# Patient Record
Sex: Male | Born: 1970 | Race: White | Hispanic: No | Marital: Married | Smoking: Never smoker
Health system: Southern US, Community
[De-identification: ages and names within clinical notes are randomized; demographics above are authoritative.]

## PROBLEM LIST (undated history)

## (undated) DIAGNOSIS — Z8619 Personal history of other infectious and parasitic diseases: Secondary | ICD-10-CM

## (undated) HISTORY — DX: Personal history of other infectious and parasitic diseases: Z86.19

## (undated) HISTORY — PX: NO PAST SURGERIES: SHX2092

## (undated) HISTORY — PX: HERNIA REPAIR: SHX51

---

## 2016-04-22 ENCOUNTER — Encounter: Payer: Self-pay | Admitting: Osteopathic Medicine

## 2016-04-22 ENCOUNTER — Ambulatory Visit (INDEPENDENT_AMBULATORY_CARE_PROVIDER_SITE_OTHER): Payer: BLUE CROSS/BLUE SHIELD | Admitting: Osteopathic Medicine

## 2016-04-22 VITALS — BP 123/80 | HR 69 | Ht 68.0 in | Wt 152.0 lb

## 2016-04-22 DIAGNOSIS — Z0000 Encounter for general adult medical examination without abnormal findings: Secondary | ICD-10-CM

## 2016-04-22 DIAGNOSIS — Z8619 Personal history of other infectious and parasitic diseases: Secondary | ICD-10-CM

## 2016-04-22 HISTORY — DX: Personal history of other infectious and parasitic diseases: Z86.19

## 2016-04-22 LAB — CBC WITH DIFFERENTIAL/PLATELET
BASOS ABS: 0 {cells}/uL (ref 0–200)
Basophils Relative: 0 %
EOS ABS: 120 {cells}/uL (ref 15–500)
Eosinophils Relative: 2 %
HEMATOCRIT: 44.3 % (ref 38.5–50.0)
HEMOGLOBIN: 15.5 g/dL (ref 13.2–17.1)
LYMPHS ABS: 2460 {cells}/uL (ref 850–3900)
Lymphocytes Relative: 41 %
MCH: 31.6 pg (ref 27.0–33.0)
MCHC: 35 g/dL (ref 32.0–36.0)
MCV: 90.4 fL (ref 80.0–100.0)
MONO ABS: 540 {cells}/uL (ref 200–950)
MPV: 9.4 fL (ref 7.5–12.5)
Monocytes Relative: 9 %
NEUTROS ABS: 2880 {cells}/uL (ref 1500–7800)
NEUTROS PCT: 48 %
Platelets: 249 10*3/uL (ref 140–400)
RBC: 4.9 MIL/uL (ref 4.20–5.80)
RDW: 12.9 % (ref 11.0–15.0)
WBC: 6 10*3/uL (ref 3.8–10.8)

## 2016-04-22 MED ORDER — ZOSTER VACCINE LIVE 19400 UNT/0.65ML ~~LOC~~ SUSR: 0.6500 mL | Freq: Once | SUBCUTANEOUS | Status: DC

## 2016-04-22 NOTE — Progress Notes (Signed)
HPI: Randy Hess is a 45 y.o. male who presents to [facility] today for chief complaint of:  Chief Complaint  Patient presents with  . Establish Care    ANNUAL AND BLOOD PRESSURE    See below for preventive care review. No concerns today.  Of note, patient has history of shingles outbreak few years ago. No history of vaccination. He has concerns about high blood pressure given family history. Has been a while since he has had any blood work done for routine screening. Previously in [employer] [place] [employer], he is pretty sure that he had a and HIV screening done at that point. Tetanus vaccine about 7 years ago  Past medical, social and family history reviewed: Past Medical History  Diagnosis Date  . History of shingles 04/22/2016   History reviewed. No pertinent past surgical history. Social History  Substance Use Topics  . Smoking status: Never Smoker   . Smokeless tobacco: Not on file  . Alcohol Use: Not on file   Family History  Problem Relation Age of Onset  . Hyperlipidemia Mother   . Hypertension Mother   . Diabetes Father   . Hyperlipidemia Father   . Hypertension Father   . Alcohol abuse Brother   . Hyperlipidemia Brother   . Hypertension Brother     Current Outpatient Prescriptions  Medication Sig Dispense Refill  . aspirin 81 MG tablet Take 81 mg by mouth daily.    . Multiple Vitamin (MULTIVITAMIN) tablet Take 1 tablet by mouth daily.     No current facility-administered medications for this visit.   No Known Allergies    Review of Systems: CONSTITUTIONAL:  No  fever, no chills, No recent illness, No unintentional weight changes HEAD/EYES/EARS/NOSE/THROAT: No  headache, no vision change, no hearing change, No sore throat, No  sinus pressure CARDIAC: No  chest pain, No  pressure, No palpitations, No  orthopnea RESPIRATORY: No  cough, No  shortness of breath/wheeze GASTROINTESTINAL: No  nausea, No  vomiting, No  abdominal pain, No   blood in stool, No  diarrhea, No  constipation  MUSCULOSKELETAL: No  myalgia/arthralgia GENITOURINARY: No  incontinence, No  abnormal genital bleeding/discharge SKIN: No  rash/wounds/concerning lesions HEM/ONC: No  easy bruising/bleeding, No  abnormal lymph node ENDOCRINE: No polyuria/polydipsia/polyphagia, No  heat/cold intolerance  NEUROLOGIC: No  weakness, No  dizziness, No  slurred speech PSYCHIATRIC: No  concerns with depression, No  concerns with anxiety, No sleep problems  Exam:  BP 123/80 mmHg  Pulse 69  Ht '5\' 8"'$  (1.727 m)  Wt 152 lb (68.947 kg)  BMI 23.12 kg/m2 Constitutional: VS see above. General Appearance: alert, well-developed, well-nourished, NAD Eyes: Normal lids and conjunctive, non-icteric sclera, PERRLA Ears, Nose, Mouth, Throat: MMM, Normal external inspection ears/nares/mouth/lips/gums, TM normal bilaterally. Pharynx/tonsils no erythema, no exudate. Nasal mucosa normal.  Neck: No masses, trachea midline. No thyroid enlargement. No tenderness/mass appreciated. No lymphadenopathy Respiratory: Normal respiratory effort. no wheeze, no rhonchi, no rales Cardiovascular: S1/S2 normal, no murmur, no rub/gallop auscultated. RRR. No lower extremity edema. Gastrointestinal: Nontender, no masses. No hepatomegaly, no splenomegaly. No hernia appreciated. Bowel sounds normal. Rectal exam deferred.  Musculoskeletal: Gait normal. No clubbing/cyanosis of digits.  Neurological: No cranial nerve deficit on limited exam. Motor and sensation intact and symmetric Skin: warm, dry, intact. No rash/ulcer. No concerning nevi or subq nodules on limited exam.   Psychiatric: Normal judgment/insight. Normal mood and affect. Oriented x3.      ASSESSMENT/PLAN: Very pleasant patient, no major concerns  based on history for early cancer screening, we discussed flu vaccine. He probably doesn't need to be taking an aspirin given his low risk factors, no diabetes, no blood pressure problems, no history  of MI, nonsmoker. Labs pending as below. If all normal, can come back in one year for repeat annual physical, sooner if needed.  Annual physical exam - Plan: CBC with Differential/Platelet, COMPLETE METABOLIC PANEL WITH GFR, Lipid panel  History of shingles - Plan: Zoster Vaccine Live, PF, (ZOSTAVAX) 58832 UNT/0.65ML injection    MALE PREVENTIVE CARE  ANNUAL SCREENING/COUNSELING Tobacco - Never  Alcohol - social drinker Diet/Exercise - HEALTHY HABITS DISCUSSED TO DECREASE CV RISK Sexual Health - yes with male  STI - no need for STI testing  INTERESTED IN STI TESTING - no Depression - PQH2 Negative Domestic violence concerns - no HTN SCREENING - SEE VITALS Vaccination status - SEE BELOW  INFECTIOUS DISEASE SCREENING HIV - all adults 15-65 - does not need GC/CT - sexually active - does not need HepC - born 39-1966 - does not need TB - if risk/required by employer - does not need  DISEASE SCREENING Lipid - (Low risk screen M35; High risk screen M25 if HTN, Tob, FH CHD M<55/F<65) - needs DM2 (45+ or Risk = FH 1st deg DM, Hx GDM, overweight/sedentary, high-risk ethnicity, HTN) - needs Osteoporosis - age 60+ or one sooner if risk - does not need AAA - once age 28-75 if smoker - does not need  CANCER SCREENING Lung - annual low dose CT Chest age 45-85 w/ 30+ PY, current/quit past 15 years - CT - does not need Colon - age 7+ or 46 years of age prior to [place] Dx - GI REFERRAL - does not need Prostate - (shared decision making age 37 in average-risk, age 75 to 80 high risk black men, men with a family history of prostate cancer younger than age 70, BRCA1 or BRCA2) - does not need  ADULT VACCINATION Influenza - annual - was not indicated Td booster every 10 years - already has HPV - age <5yo - was not indicated Zoster - age 74+ - was given Pneumonia - age 18+ sooner if risk (DM, smoker, other) - was not indicated  OTHER Fall - exercise and Vit D age 24+ - does not need Consider  ASA - age 48-59 - does not need  All questions were answered. Visit summary with medication list and pertinent instructions was printed for patient to review. ER/RTC precautions were reviewed with the patient. Return in about 1 year (around 04/22/2017), or sooner if needed, for [employer] .

## 2016-04-23 LAB — COMPLETE METABOLIC PANEL WITH GFR
ALBUMIN: 4.6 g/dL (ref 3.6–5.1)
ALK PHOS: 48 U/L (ref 40–115)
ALT: 13 U/L (ref 9–46)
AST: 17 U/L (ref 10–40)
BILIRUBIN TOTAL: 0.6 mg/dL (ref 0.2–1.2)
BUN: 22 mg/dL (ref 7–25)
CALCIUM: 9.6 mg/dL (ref 8.6–10.3)
CO2: 27 mmol/L (ref 20–31)
Chloride: 104 mmol/L (ref 98–110)
Creat: 1.08 mg/dL (ref 0.60–1.35)
GFR, EST NON AFRICAN AMERICAN: 82 mL/min (ref >=60)
GFR, Est African American: >89 mL/min (ref >=60)
Glucose, Bld: 79 mg/dL (ref 65–99)
POTASSIUM: 4.5 mmol/L (ref 3.5–5.3)
Sodium: 140 mmol/L (ref 135–146)
TOTAL PROTEIN: 7.2 g/dL (ref 6.1–8.1)

## 2016-04-23 LAB — LIPID PANEL
CHOL/HDL RATIO: 3 ratio (ref <=5.0)
CHOLESTEROL: 152 mg/dL (ref 125–200)
HDL: 51 mg/dL (ref >=40)
LDL Cholesterol: 83 mg/dL (ref <130)
Triglycerides: 91 mg/dL (ref <150)
VLDL: 18 mg/dL (ref <30)

## 2016-10-05 ENCOUNTER — Ambulatory Visit (INDEPENDENT_AMBULATORY_CARE_PROVIDER_SITE_OTHER): Payer: BLUE CROSS/BLUE SHIELD | Admitting: Osteopathic Medicine

## 2016-10-05 ENCOUNTER — Encounter: Payer: Self-pay | Admitting: Osteopathic Medicine

## 2016-10-05 VITALS — BP 129/78 | HR 75 | Ht 68.0 in | Wt 149.0 lb

## 2016-10-05 DIAGNOSIS — R21 Rash and other nonspecific skin eruption: Secondary | ICD-10-CM | POA: Diagnosis not present

## 2016-10-05 NOTE — Progress Notes (Signed)
HPI: Randy Hess is a 45 y.o. male  who presents [facility] today, 10/05/16,  for chief complaint of:  Chief Complaint  Patient presents with  . Rash    RIGHT SIDE OF FACE NEAR EYE    . Location: r temple and R side of nose . Quality: red, scaly. No itch or pain . Severity: mild . Duration: several years, but hasn't gotten any worse over that time   Past medical, surgical, social and family history reviewed: Patient Active Problem List   Diagnosis Date Noted  . History of shingles 04/22/2016   No past surgical history on file. Social History  Substance Use Topics  . Smoking status: Never Smoker  . Smokeless tobacco: Not on file  . Alcohol use Not on file   Family History  Problem Relation Age of Onset  . Hyperlipidemia Mother   . Hypertension Mother   . Diabetes Father   . Hyperlipidemia Father   . Hypertension Father   . Alcohol abuse Brother   . Hyperlipidemia Brother   . Hypertension Brother      Current medication list and allergy/intolerance information reviewed:   Current Outpatient Prescriptions on File Prior to Visit  Medication Sig Dispense Refill  . aspirin 81 MG tablet Take 81 mg by mouth daily.    . Multiple Vitamin (MULTIVITAMIN) tablet Take 1 tablet by mouth daily.    Marland Kitchen. Zoster Vaccine Live, PF, (ZOSTAVAX) [id number] UNT/0.65ML injection Inject 19,400 Units into the skin once. Please forward proof of vaccinatinon to Dr Lyn HollingsheadAlexander Fax # [phone] 1 each 0   No current facility-administered medications on file prior to visit.    No Known Allergies    Review of Systems:  Constitutional: No recent illness  HEENT: No  headache, no vision change  Skin: +  Rash   Exam:  BP 129/78   Pulse 75   Ht 5\' 8"  (1.727 m)   Wt 149 lb (67.6 kg)   BMI 22.66 kg/m   Constitutional: VS see above. General Appearance: alert, well-developed, well-nourished, NAD  Ears, Nose, Mouth, Throat: MMM,  Respiratory: Normal  respiratory effort.   Neurological: Normal balance/coordination. No tremor.  Skin: warm, dry, intact. Small mild blanching erythematous patches R temple and R side of nose, no ulceration/drainge, mild scaling, c/w actinic change vs ezcematous change  Psychiatric: Normal judgment/insight. Normal mood and affect. Oriented x3.        ASSESSMENT/PLAN:   Looks like actinic change to me, reassuring that no change/worsening for a few years, offered trial imiquimod vs freezing versus derm second opinion for possible biopsy, opts for referral.   Rash and nonspecific skin eruption - Plan: Ambulatory referral to Dermatology     All questions at time of visit were answered - patient instructed to contact office with any additional concerns. ER/RTC precautions were reviewed with the patient. Follow-up plan: Return if symptoms worsen or fail to improve.

## 2019-06-03 ENCOUNTER — Telehealth: Payer: Self-pay

## 2019-06-03 DIAGNOSIS — Z0000 Encounter for general adult medical examination without abnormal findings: Secondary | ICD-10-CM

## 2019-06-03 NOTE — Telephone Encounter (Signed)
Pt called - requesting lab order for annual check. As per pt, he has an appt scheduled for 08/09/19 with provider. Thanks.

## 2019-06-05 NOTE — Telephone Encounter (Signed)
Orders are in, he can come to the lab fasting at any time, does not need appointment.

## 2019-06-05 NOTE — Telephone Encounter (Signed)
Called patient and notified orders  Sent to lab and can go down and have done at his convience. KG LPN

## 2019-07-19 ENCOUNTER — Other Ambulatory Visit: Payer: Self-pay

## 2019-07-19 ENCOUNTER — Ambulatory Visit (INDEPENDENT_AMBULATORY_CARE_PROVIDER_SITE_OTHER): Payer: BLUE CROSS/BLUE SHIELD | Admitting: Family Medicine

## 2019-07-19 VITALS — BP 129/78 | HR 71 | Temp 98.1°F | Wt 148.0 lb

## 2019-07-19 DIAGNOSIS — K4090 Unilateral inguinal hernia, without obstruction or gangrene, not specified as recurrent: Secondary | ICD-10-CM | POA: Diagnosis not present

## 2019-07-19 DIAGNOSIS — Z6822 Body mass index (BMI) 22.0-22.9, adult: Secondary | ICD-10-CM

## 2019-07-19 DIAGNOSIS — Z23 Encounter for immunization: Secondary | ICD-10-CM

## 2019-07-19 NOTE — Progress Notes (Signed)
Randy Hess is a 48 y.o. male who presents to [facility] today for inguinal mass.  Patient developed a mass in his right groin about 8 days ago.  He notes that it tends to come and go throughout the day.  It tends to be worse after a long day of standing and activity and better with laying down in the evening.  He notes that sometimes a little bit sore.  He is concerned that he may have developed a hernia.  He has no prior history of hernia or abdominal surgery.  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   ROS as above:  Exam:  BP 129/78   Pulse 71   Temp 98.1 F (36.7 C) (Oral)   Wt 148 lb (67.1 kg)   BMI 22.50 kg/m  Wt Readings from Last 5 Encounters:  07/19/19 148 lb (67.1 kg)  10/05/16 149 lb (67.6 kg)  04/22/16 152 lb (68.9 kg)    Gen: Well NAD HEENT: EOMI,  MMM Lungs: Normal work of breathing. CTABL Heart: RRR no MRG Abd: NABS, Soft. Nondistended, Nontender.  Pelvis: Slight reducible bulge right inguinal region. Slight herniation present at external inguinal ring with cough.  Easily reducible.  Not particularly tender.  Testicles descended bilaterally and nontender with no masses. Exts: Brisk capillary refill, warm and well perfused.     Assessment and Plan: 48 y.o. male with right inguinal hernia.  Small and easily reducible.  It is reasonable to have evaluation with general surgery for discussion of surgical planning.  Explained that this is not an emergency and he can go on an upcoming biology research field trip in November but this should be addressed in near future.  Discussed precautions including worsening.  Patient has visit scheduled with PCP in near future for well adult visit.  Tdap and  flu vaccine administered today prior to discharge.  PDMP not reviewed this encounter. Orders Placed This Encounter  Procedures  . Tdap vaccine greater than or equal to 7yo IM  . Flu Vaccine QUAD 36+ mos IM  . Ambulatory referral to General Surgery    Referral Priority:   Routine    Referral Type:   Surgical    Referral Reason:   Specialty Services Required    Requested Specialty:   General Surgery    Number of Visits Requested:   1   No orders of the defined types were placed in this encounter.    Historical information moved to improve visibility of documentation.  Past Medical History:  Diagnosis Date  . History of shingles 04/22/2016   No past surgical history on file. Social History   Tobacco Use  . Smoking status: Never Smoker  Substance Use Topics  . Alcohol use: Not on file   family history includes Alcohol abuse in his brother; Diabetes in his father; Hyperlipidemia in his brother, father, and mother; Hypertension in his brother, father, and mother.  Medications: Current Outpatient Medications  Medication Sig Dispense Refill  . aspirin 81 MG tablet Take 81 mg by mouth daily.    . Multiple Vitamin (MULTIVITAMIN) tablet Take 1 tablet by mouth daily.     No current facility-administered medications for this visit.    No Known Allergies   Discussed warning signs or symptoms. Please see discharge instructions. Patient expresses  understanding.

## 2019-07-19 NOTE — Patient Instructions (Signed)
Thank you for coming in today.  You should hear from Surgery soon.  Let me know if you do not hear anything.  Continue normal activity.    Inguinal Hernia, Adult An inguinal hernia develops when fat or the intestines push through a weak spot in a muscle where your leg meets your lower abdomen (groin). This creates a bulge. This kind of hernia could also be:  In your scrotum, if you are male.  In folds of skin around your vagina, if you are male. There are three types of inguinal hernias:  Hernias that can be pushed back into the abdomen (are reducible). This type rarely causes pain.  Hernias that are not reducible (are incarcerated).  Hernias that are not reducible and lose their blood supply (are strangulated). This type of hernia requires emergency surgery. What are the causes? This condition is caused by having a weak spot in the muscles or tissues in the groin. This weak spot develops over time. The hernia may poke through the weak spot when you suddenly strain your lower abdominal muscles, such as when you:  Lift a heavy object.  Strain to have a bowel movement. Constipation can lead to straining.  Cough. What increases the risk? This condition is more likely to develop in:  Men.  Pregnant women.  People who: ? Are overweight. ? Work in jobs that require long periods of standing or heavy lifting. ? Have had an inguinal hernia before. ? Smoke or have lung disease. These factors can lead to long-lasting (chronic) coughing. What are the signs or symptoms? Symptoms may depend on the size of the hernia. Often, a small inguinal hernia has no symptoms. Symptoms of a larger hernia may include:  A lump in the groin area. This is easier to see when standing. It might not be visible when lying down.  Pain or burning in the groin. This may get worse when lifting, straining, or coughing.  A dull ache or a feeling of pressure in the groin.  In men, an unusual lump in the  scrotum. Symptoms of a strangulated inguinal hernia may include:  A bulge in your groin that is very painful and tender to the touch.  A bulge that turns red or purple.  Fever, nausea, and vomiting.  Inability to have a bowel movement or to pass gas. How is this diagnosed? This condition is diagnosed based on your symptoms, your medical history, and a physical exam. Your health care provider may feel your groin area and ask you to cough. How is this treated? Treatment depends on the size of your hernia and whether you have symptoms. If you do not have symptoms, your health care provider may have you watch your hernia carefully and have you come in for follow-up visits. If your hernia is large or if you have symptoms, you may need surgery to repair the hernia. Follow these instructions at home: Lifestyle  Avoid lifting heavy objects.  Avoid standing for long periods of time.  Do not use any products that contain nicotine or tobacco, such as cigarettes and e-cigarettes. If you need help quitting, ask your health care provider.  Maintain a healthy weight. Preventing constipation  Take actions to prevent constipation. Constipation leads to straining with bowel movements, which can make a hernia worse or cause a hernia repair to break down. Your health care provider may recommend that you: ? Drink enough fluid to keep your urine pale yellow. ? Eat foods that are high in fiber, such  as fresh fruits and vegetables, whole grains, and beans. ? Limit foods that are high in fat and processed sugars, such as fried or sweet foods. ? Take an over-the-counter or prescription medicine for constipation. General instructions  You may try to push the hernia back in place by very gently pressing on it while lying down. Do not try to force the bulge back in if it will not push in easily.  Watch your hernia for any changes in shape, size, or color. Get help right away if you notice any changes.  Take  over-the-counter and prescription medicines only as told by your health care provider.  Keep all follow-up visits as told by your health care provider. This is important. Contact a health care provider if:  You have a fever.  You develop new symptoms.  Your symptoms get worse. Get help right away if:  You have pain in your groin that suddenly gets worse.  You have a bulge in your groin that: ? Suddenly gets bigger and does not get smaller. ? Becomes red or purple or painful to the touch.  You are a man and you have a sudden pain in your scrotum, or the size of your scrotum suddenly changes.  You cannot push the hernia back in place by very gently pressing on it when you are lying down. Do not try to force the bulge back in if it will not push in easily.  You have nausea or vomiting that does not go away.  You have a fast heartbeat.  You cannot have a bowel movement or pass gas. These symptoms may represent a serious problem that is an emergency. Do not wait to see if the symptoms will go away. Get medical help right away. Call your local emergency services (911 in the [place] Summary  An inguinal hernia develops when fat or the intestines push through a weak spot in a muscle where your leg meets your lower abdomen (groin).  This condition is caused by having a weak spot in muscles or tissue in your groin.  Symptoms may depend on the size of the hernia, and they may include pain or swelling in your groin. A small inguinal hernia often has no symptoms.  Treatment may not be needed if you do not have symptoms. If you have symptoms or a large hernia, you may need surgery to repair the hernia.  Avoid lifting heavy objects. Also avoid standing for long amounts of time. This information is not intended to replace advice given to you by your health care provider. Make sure you discuss any questions you have with your health care provider. Document Released: 02/19/2009 Document Revised:  11/04/2017 Document Reviewed: 07/05/2017 Elsevier Patient Education  2020 [employer].

## 2019-07-26 LAB — COMPLETE METABOLIC PANEL WITH GFR
AG Ratio: 1.8 (calc) (ref 1.0–2.5)
ALT: 15 U/L (ref 9–46)
AST: 17 U/L (ref 10–40)
Albumin: 4.6 g/dL (ref 3.6–5.1)
Alkaline phosphatase (APISO): 49 U/L (ref 36–130)
BUN: 20 mg/dL (ref 7–25)
CO2: 27 mmol/L (ref 20–32)
Calcium: 9.7 mg/dL (ref 8.6–10.3)
Chloride: 103 mmol/L (ref 98–110)
Creat: 1 mg/dL (ref 0.60–1.35)
GFR, Est African American: 102 mL/min/{1.73_m2} (ref >=60)
GFR, Est Non African American: 88 mL/min/{1.73_m2} (ref >=60)
Globulin: 2.6 g/dL (calc) (ref 1.9–3.7)
Glucose, Bld: 101 mg/dL — ABNORMAL HIGH (ref 65–99)
Potassium: 4.2 mmol/L (ref 3.5–5.3)
Sodium: 140 mmol/L (ref 135–146)
Total Bilirubin: 0.7 mg/dL (ref 0.2–1.2)
Total Protein: 7.2 g/dL (ref 6.1–8.1)

## 2019-07-26 LAB — CBC
HCT: 44.8 % (ref 38.5–50.0)
Hemoglobin: 15.6 g/dL (ref 13.2–17.1)
MCH: 31.6 pg (ref 27.0–33.0)
MCHC: 34.8 g/dL (ref 32.0–36.0)
MCV: 90.9 fL (ref 80.0–100.0)
MPV: 9.7 fL (ref 7.5–12.5)
Platelets: 262 10*3/uL (ref 140–400)
RBC: 4.93 10*6/uL (ref 4.20–5.80)
RDW: 11.9 % (ref 11.0–15.0)
WBC: 5.1 10*3/uL (ref 3.8–10.8)

## 2019-07-26 LAB — LIPID PANEL
Cholesterol: 171 mg/dL (ref <200)
HDL: 52 mg/dL (ref >=40)
LDL Cholesterol (Calc): 104 mg/dL (calc) — ABNORMAL HIGH
Non-HDL Cholesterol (Calc): 119 mg/dL (calc) (ref <130)
Total CHOL/HDL Ratio: 3.3 (calc) (ref <5.0)
Triglycerides: 67 mg/dL (ref <150)

## 2019-07-26 LAB — HEMOGLOBIN A1C W/OUT EAG: Hgb A1c MFr Bld: 5 % of total Hgb (ref <5.7)

## 2019-08-09 ENCOUNTER — Ambulatory Visit (INDEPENDENT_AMBULATORY_CARE_PROVIDER_SITE_OTHER): Payer: BLUE CROSS/BLUE SHIELD | Admitting: Osteopathic Medicine

## 2019-08-09 ENCOUNTER — Encounter: Payer: Self-pay | Admitting: Osteopathic Medicine

## 2019-08-09 ENCOUNTER — Other Ambulatory Visit: Payer: Self-pay

## 2019-08-09 VITALS — BP 116/78 | HR 63 | Temp 98.1°F | Wt 148.0 lb

## 2019-08-09 DIAGNOSIS — Z0000 Encounter for general adult medical examination without abnormal findings: Secondary | ICD-10-CM | POA: Diagnosis not present

## 2019-08-09 DIAGNOSIS — Z1211 Encounter for screening for malignant neoplasm of colon: Secondary | ICD-10-CM | POA: Diagnosis not present

## 2019-08-09 DIAGNOSIS — Z23 Encounter for immunization: Secondary | ICD-10-CM | POA: Diagnosis not present

## 2019-08-09 NOTE — Patient Instructions (Signed)
General Preventive Care  Most recent routine screening lipids/other labs: already done!   Everyone should have blood pressure checked once per year.  goal 130/80 or less  Tobacco: don't!   Alcohol: responsible moderation is ok for most adults - if you have concerns about your alcohol intake, please talk to me!   Exercise: as tolerated to reduce risk of cardiovascular disease and diabetes. Strength training will also prevent osteoporosis.   Mental health: if need for mental health care (medicines, counseling, other), or concerns about moods, please let me know!   Sexual health: if need for STD testing, or if concerns with libido/pain problems, please let me know!  Advanced Directive: Living Will and/or Healthcare Power of Attorney recommended for all adults, regardless of age or health.  Vaccines  Flu vaccine: recommended for almost everyone, every fall.   Shingles vaccine: Shingrix recommended after age 70.  Pneumonia vaccines: Prevnar and Pneumovax recommended after age 60  Tetanus booster: Tdap recommended every 10 years. Due 2030 Cancer screenings   Colon cancer screening: recommended for everyone at age 23  Prostate cancer screening: PSA blood test around age 74  Lung cancer screening: not needed for non-smokers  Infection screenings . HIV: recommended screening at least once age 44-65, more often as needed. . Gonorrhea/Chlamydia: screening as needed . Hepatitis C: recommended for anyone born 46-1966 . TB: certain at-risk populations, or depending on work requirements and/or travel history Other . Bone Density Test: recommended for men at age 40, sooner depending on risk factors . Abdominal Aortic Aneurysm: screening with ultrasound recommended once for men age 39-75 who have ever smoked 100+ cigarettes

## 2019-08-09 NOTE — Progress Notes (Signed)
HPI: Randy Hess is a 48 y.o. male who  has a past medical history of History of shingles (04/22/2016).  he presents to [facility] today, 08/09/19,  for chief complaint of: Annual physical     Patient here for annual physical / wellness exam.  See preventive care reviewed as below.  Recent labs reviewed in detail with the patient.   Additional concerns today include:  Inguinal hernia bothersome but no worse, scheduled w/ surgeon      Past medical, surgical, social and family history reviewed:  Patient Active Problem List   Diagnosis Date Noted  . Inguinal hernia 07/19/2019  . Rash and nonspecific skin eruption 10/05/2016  . History of shingles 04/22/2016    No past surgical history on file.  Social History   Tobacco Use  . Smoking status: Never Smoker  . Smokeless tobacco: Never Used  Substance Use Topics  . Alcohol use: Not Currently    Alcohol/week: 0.0 standard drinks    Family History  Problem Relation Age of Onset  . Hyperlipidemia Mother   . Hypertension Mother   . Diabetes Father   . Hyperlipidemia Father   . Hypertension Father   . Alcohol abuse Brother   . Hyperlipidemia Brother   . Hypertension Brother      Current medication list and allergy/intolerance information reviewed:    Current Outpatient Medications  Medication Sig Dispense Refill  . Multiple Vitamin (MULTIVITAMIN) tablet Take 1 tablet by mouth daily.    Marland Kitchen aspirin 81 MG tablet Take 81 mg by mouth daily.     No current facility-administered medications for this visit.     No Known Allergies    Review of Systems:  Constitutional:  No  fever, no chills, No recent illness, No unintentional weight changes. No significant fatigue.   HEENT: No  headache, no vision change, no hearing change, No sore throat, No  sinus pressure  Cardiac: No  chest pain, No  pressure, No palpitations, No  Orthopnea  Respiratory:  No  shortness of breath. No   Cough  Gastrointestinal: No  abdominal pain, No  nausea, No  vomiting,  No  blood in stool, No  diarrhea, No  constipation   Musculoskeletal: No new myalgia/arthralgia  Skin: No  Rash, No other wounds/concerning lesions  Genitourinary: No  incontinence, No  abnormal genital bleeding, No abnormal genital discharge  Hem/Onc: No  easy bruising/bleeding, No  abnormal lymph node  Endocrine: No cold intolerance,  No heat intolerance. No polyuria/polydipsia/polyphagia   Neurologic: No  weakness, No  dizziness, No  slurred speech/focal weakness/facial droop  Psychiatric: No  concerns with depression, No  concerns with anxiety, No sleep problems, No mood problems  Exam:  BP 116/78 (BP Location: Left Arm, Patient Position: Sitting, Cuff Size: Normal)   Pulse 63   Temp 98.1 F (36.7 C) (Oral)   Wt 148 lb (67.1 kg)   BMI 22.50 kg/m   Constitutional: VS see above. General Appearance: alert, well-developed, well-nourished, NAD  Eyes: Normal lids and conjunctive, non-icteric sclera  Ears, Nose, Mouth, Throat:  TM normal bilaterally.   Neck: No masses, trachea midline. No thyroid enlargement. No tenderness/mass appreciated. No lymphadenopathy  Respiratory: Normal respiratory effort. no wheeze, no rhonchi, no rales  Cardiovascular: S1/S2 normal, no murmur, no rub/gallop auscultated. RRR. No lower extremity edema.   Gastrointestinal: Nontender, no masses. No hepatomegaly, no splenomegaly. No hernia appreciated. Bowel sounds normal. Rectal exam deferred.   Musculoskeletal: Gait normal. No clubbing/cyanosis of  digits.   Neurological: Normal balance/coordination. No tremor. No cranial nerve deficit on limited exam. Motor and sensation intact and symmetric. Cerebellar reflexes intact.   Skin: warm, dry, intact. No rash/ulcer. No concerning nevi or subq nodules on limited exam.    Psychiatric: Normal judgment/insight. Normal mood and affect. Oriented x3.    No results found for this or any  previous visit (from the past 72 hour(s)).  No results found.     ASSESSMENT/PLAN: The primary encounter diagnosis was Annual physical exam. Diagnoses of Colon cancer screening and Need for shingles vaccine were also pertinent to this visit.  Ordered GI referral, patient will be 49 in January and would like to get a colonoscopy set up sometime in the spring.  Set myself a reminder for nurse visit in January to get shingles shots started  Recent Results (from the past 2160 hour(s))  CBC     Status: None   Collection Time: 07/19/19 10:32 AM  Result Value Ref Range   WBC 5.1 3.8 - 10.8 Thousand/uL   RBC 4.93 4.20 - 5.80 Million/uL   Hemoglobin 15.6 13.2 - 17.1 g/dL   HCT 44.8 38.5 - 50.0 %   MCV 90.9 80.0 - 100.0 fL   MCH 31.6 27.0 - 33.0 pg   MCHC 34.8 32.0 - 36.0 g/dL   RDW 11.9 11.0 - 15.0 %   Platelets 262 140 - 400 Thousand/uL   MPV 9.7 7.5 - 12.5 fL  COMPLETE METABOLIC PANEL WITH GFR     Status: Abnormal   Collection Time: 07/19/19 10:32 AM  Result Value Ref Range   Glucose, Bld 101 (H) 65 - 99 mg/dL    Comment: .            Fasting reference interval . For someone without known diabetes, a glucose value between 100 and 125 mg/dL is consistent with prediabetes and should be confirmed with a follow-up test. .    BUN 20 7 - 25 mg/dL   Creat 1.00 0.60 - 1.35 mg/dL   GFR, Est Non African American 88 > OR = 60 mL/min/1.34m2   GFR, Est African American 102 > OR = 60 mL/min/1.45m2   BUN/Creatinine Ratio NOT APPLICABLE 6 - 22 (calc)   Sodium 140 135 - 146 mmol/L   Potassium 4.2 3.5 - 5.3 mmol/L   Chloride 103 98 - 110 mmol/L   CO2 27 20 - 32 mmol/L   Calcium 9.7 8.6 - 10.3 mg/dL   Total Protein 7.2 6.1 - 8.1 g/dL   Albumin 4.6 3.6 - 5.1 g/dL   Globulin 2.6 1.9 - 3.7 g/dL (calc)   AG Ratio 1.8 1.0 - 2.5 (calc)   Total Bilirubin 0.7 0.2 - 1.2 mg/dL   Alkaline phosphatase (APISO) 49 36 - 130 U/L   AST 17 10 - 40 U/L   ALT 15 9 - 46 U/L  Lipid panel     Status: Abnormal    Collection Time: 07/19/19 10:32 AM  Result Value Ref Range   Cholesterol 171 <200 mg/dL   HDL 52 > OR = 40 mg/dL   Triglycerides 67 <150 mg/dL   LDL Cholesterol (Calc) 104 (H) mg/dL (calc)    Comment: Reference range: <100 . Desirable range <100 mg/dL for primary prevention;   <70 mg/dL for patients with CHD or diabetic patients  with > or = 2 CHD risk factors. Marland Kitchen LDL-C is now calculated using the Martin-Hopkins  calculation, which is a validated novel method providing  better accuracy than  the Friedewald equation in the  estimation of LDL-C.  Horald Pollen et al. Lenox Ahr. 1610;960(45): 2061-2068  (http://education.[facility].com/faq/FAQ164)    Total CHOL/HDL Ratio 3.3 <5.0 (calc)   Non-HDL Cholesterol (Calc) 119 <130 mg/dL (calc)    Comment: For patients with diabetes plus 1 major ASCVD risk  factor, treating to a non-HDL-C goal of <100 mg/dL  (LDL-C of <40 mg/dL) is considered a therapeutic  option.   Hemoglobin A1C w/out eAG     Status: None   Collection Time: 07/19/19 10:32 AM  Result Value Ref Range   Hgb A1c MFr Bld 5.0 <5.7 % of total Hgb    Comment: For the purpose of screening for the presence of diabetes: . <5.7%       Consistent with the absence of diabetes 5.7-6.4%    Consistent with increased risk for diabetes             (prediabetes) > or =6.5%  Consistent with diabetes . This assay result is consistent with a decreased risk of diabetes. . Currently, no consensus exists regarding use of hemoglobin A1c for diagnosis of diabetes in children. . According to American Diabetes Association (ADA) guidelines, hemoglobin A1c <7.0% represents optimal control in non-pregnant diabetic patients. Different metrics may apply to specific patient populations.  Standards of Medical Care in Diabetes(ADA). .      Orders Placed This Encounter  Procedures  . Ambulatory referral to Gastroenterology    No orders of the defined types were placed in this  encounter.  The 10-year ASCVD risk score Denman George DC Montez Hageman., et al., 2013) is: 2%   Values used to calculate the score:     Age: 34 years     Sex: Male     Is Non-Hispanic African American: No     Diabetic: No     Tobacco smoker: No     Systolic Blood Pressure: 116 mmHg     Is BP treated: No     HDL Cholesterol: 52 mg/dL     Total Cholesterol: 171 mg/dL   Patient Instructions  General Preventive Care  Most recent routine screening lipids/other labs: already done!   Everyone should have blood pressure checked once per year.  goal 130/80 or less  Tobacco: don't!   Alcohol: responsible moderation is ok for most adults - if you have concerns about your alcohol intake, please talk to me!   Exercise: as tolerated to reduce risk of cardiovascular disease and diabetes. Strength training will also prevent osteoporosis.   Mental health: if need for mental health care (medicines, counseling, other), or concerns about moods, please let me know!   Sexual health: if need for STD testing, or if concerns with libido/pain problems, please let me know!  Advanced Directive: Living Will and/or Healthcare Power of Attorney recommended for all adults, regardless of age or health.  Vaccines  Flu vaccine: recommended for almost everyone, every fall.   Shingles vaccine: Shingrix recommended after age 12.  Pneumonia vaccines: Prevnar and Pneumovax recommended after age 70  Tetanus booster: Tdap recommended every 10 years. Due 2030 Cancer screenings   Colon cancer screening: recommended for everyone at age 65  Prostate cancer screening: PSA blood test around age 24  Lung cancer screening: not needed for non-smokers  Infection screenings . HIV: recommended screening at least once age 11-65, more often as needed. . Gonorrhea/Chlamydia: screening as needed . Hepatitis C: recommended for anyone born [birth year]-1965 . TB: certain at-risk populations, or depending on work requirements and/or travel  history  Other . Bone Density Test: recommended for men at age 49, sooner depending on risk factors . Abdominal Aortic Aneurysm: screening with ultrasound recommended once for men age 49-75 who have ever smoked 100+ cigarettes        Visit summary with medication list and pertinent instructions was printed for patient to review. All questions at time of visit were answered - patient instructed to contact office with any additional concerns or updates. ER/RTC precautions were reviewed with the patient.    Please note: voice recognition software was used to produce this document, and typos may escape review. Please contact Dr. Lyn HollingsheadAlexander for any needed clarifications.     Follow-up plan: Return in about 1 year (around 08/08/2020) [place] (call week prior to visit for lab orders).

## 2019-09-19 ENCOUNTER — Encounter (HOSPITAL_BASED_OUTPATIENT_CLINIC_OR_DEPARTMENT_OTHER): Payer: Self-pay | Admitting: *Deleted

## 2019-09-19 ENCOUNTER — Other Ambulatory Visit: Payer: Self-pay

## 2019-09-24 ENCOUNTER — Other Ambulatory Visit (HOSPITAL_COMMUNITY)
Admission: RE | Admit: 2019-09-24 | Discharge: 2019-09-24 | Disposition: A | Discharge status: Home / Self Care | Payer: BLUE CROSS/BLUE SHIELD | Source: Ambulatory Visit | Attending: Surgery | Admitting: Surgery

## 2019-09-24 DIAGNOSIS — Z01812 Encounter for preprocedural laboratory examination: Secondary | ICD-10-CM | POA: Insufficient documentation

## 2019-09-24 DIAGNOSIS — Z20828 Contact with and (suspected) exposure to other viral communicable diseases: Secondary | ICD-10-CM | POA: Diagnosis not present

## 2019-09-25 LAB — NOVEL CORONAVIRUS, NAA (HOSP ORDER, SEND-OUT TO REF LAB; TAT 18-24 HRS): SARS-CoV-2, NAA: NOT DETECTED

## 2019-09-26 NOTE — Progress Notes (Signed)
Left message for patient to pick up drink and soap.

## 2019-09-26 NOTE — H&P (Signed)
Carney Harder Documented: 07/26/2019 1:57 PM Location: [facility] Patient #: [id number] DOB: [birth year] Undefined / Language: Cleophus Molt / Race: White Male   History of Present Illness Rodman Key B. Hassell Done MD; 07/26/2019 2:55 PM) The patient is a 48 year old male who presents with an inguinal hernia. ~2 month hx of right inguinal hernia. I discussed open and laparoscopic repairs with him in detail. I gave him two books about repair. 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 He has field trips in [place] to repair his hernia in December.    Past Surgical History Emeline Gins, [place]; 07/26/2019 1:57 PM) No pertinent past surgical history   Diagnostic Studies History Emeline Gins, [place]; 07/26/2019 1:57 PM) Colonoscopy  never  Allergies Emeline Gins, CMA; 07/26/2019 1:58 PM) No Known Drug Allergies [07/26/2019]: Allergies Reconciled   Medication History Emeline Gins, [place]; 07/26/2019 1:58 PM) Medications Reconciled  Social History Emeline Gins, [place]; 07/26/2019 1:57 PM) Alcohol use  Occasional alcohol use. Caffeine use  Coffee. No drug use  Tobacco use  Never smoker.  Family History Emeline Gins, [place]; 07/26/2019 1:57 PM) Alcohol Abuse  Brother. Arthritis  Mother. Depression  Father. Diabetes Mellitus  Father. Hypertension  Brother, Father, Mother.  Other Problems Emeline Gins, [place]; 07/26/2019 1:57 PM) No pertinent past medical history     Review of Systems Emeline Gins CMA; 07/26/2019 1:57 PM) General Not Present- Appetite Loss, Chills, Fatigue, Fever, Night Sweats, Weight Gain and Weight Loss. Skin Not Present- Change in Wart/Mole, Dryness, Hives, Jaundice, New Lesions, Non-Healing Wounds, Rash and Ulcer. HEENT Not Present- Earache, Hearing Loss, Hoarseness, Nose Bleed, Oral Ulcers, Ringing in the Ears, Seasonal Allergies, Sinus Pain, Sore Throat, Visual Disturbances, Wears  glasses/contact lenses and Yellow Eyes. Respiratory Not Present- Bloody sputum, Chronic Cough, Difficulty Breathing, Snoring and Wheezing. Breast Not Present- Breast Mass, Breast Pain, Nipple Discharge and Skin Changes. Cardiovascular Not Present- Chest Pain, Difficulty Breathing Lying Down, Leg Cramps, Palpitations, Rapid Heart Rate, Shortness of Breath and Swelling of Extremities. Gastrointestinal Not Present- Abdominal Pain, Bloating, Bloody Stool, Change in Bowel Habits, Chronic diarrhea, Constipation, Difficulty Swallowing, Excessive gas, Gets full quickly at meals, Hemorrhoids, Indigestion, Nausea, Rectal Pain and Vomiting. Male Genitourinary Not Present- Blood in Urine, Change in Urinary Stream, Frequency, Impotence, Nocturia, Painful Urination, Urgency and Urine Leakage. Neurological Not Present- Decreased Memory, Fainting, Headaches, Numbness, Seizures, Tingling, Tremor, Trouble walking and Weakness. Psychiatric Not Present- Anxiety, Bipolar, Change in Sleep Pattern, Depression, Fearful and Frequent crying. Endocrine Not Present- Cold Intolerance, Excessive Hunger, Hair Changes, Heat Intolerance, Hot flashes and New Diabetes.  Vitals Emeline Gins CMA; 07/26/2019 1:58 PM) 07/26/2019 1:57 PM Weight: 147.4 lb Height: 68in Body Surface Area: 1.8 m Body Mass Index: 22.41 kg/m  Temp.: 98.14F  Pulse: 84 (Regular)  BP: 116/76 (Sitting, Left Arm, Standard)       Physical Exam (Waylin Dorko B. Hassell Done MD; 07/26/2019 2:57 PM) The physical exam findings are as follows: Note:WDWNWM NAD HEENT unremarkable Neck supple Chest clear Heart SR Abdomen nontender GU right inguinal hernia-visible left he says at times he can feel a small hernia Ext FRM    Assessment & Plan Rodman Key B. Hassell Done MD; 07/26/2019 2:58 PM) RIGHT INGUINAL HERNIA (K40.90) Impression: New right inguinal hernia-he will decide if he wants lap or open He wants to schedule in December. [facility] ok for  open--[facility] for laparoscopic  Signed by Pedro Earls, MD (07/26/2019 2:59 PM)

## 2019-09-26 NOTE — Anesthesia Preprocedure Evaluation (Addendum)
Anesthesia Evaluation  Patient identified by MRN, date of birth, ID band Patient awake    Reviewed: Allergy & Precautions, NPO status , Patient's Chart, lab work & pertinent test results, Unable to perform ROS - Chart review only  Airway Mallampati: II  TM Distance: >3 FB Neck ROM: Full    Dental no notable dental hx. (+) Teeth Intact, Dental Advisory Given   Pulmonary neg pulmonary ROS,    Pulmonary exam normal breath sounds clear to auscultation       Cardiovascular Exercise Tolerance: Good Normal cardiovascular exam Rhythm:Regular Rate:Normal     Neuro/Psych negative neurological ROS  negative psych ROS   GI/Hepatic negative GI ROS, Neg liver ROS,   Endo/Other  negative endocrine ROS  Renal/GU      Musculoskeletal negative musculoskeletal ROS (+)   Abdominal   Peds  Hematology   Anesthesia Other Findings   Reproductive/Obstetrics                          Anesthesia Physical Anesthesia Plan  ASA: II  Anesthesia Plan: General   Post-op Pain Management:    Induction: Intravenous  PONV Risk Score and Plan: 3 and Treatment may vary due to age or medical condition, Ondansetron, Dexamethasone and Midazolam  Airway Management Planned: LMA  Additional Equipment: None  Intra-op Plan:   Post-operative Plan:   Informed Consent: I have reviewed the patients History and Physical, chart, labs and discussed the procedure including the risks, benefits and alternatives for the proposed anesthesia with the patient or authorized representative who has indicated his/her understanding and acceptance.     Dental advisory given  Plan Discussed with:   Anesthesia Plan Comments:        Anesthesia Quick Evaluation

## 2019-09-27 ENCOUNTER — Other Ambulatory Visit: Payer: Self-pay

## 2019-09-27 ENCOUNTER — Encounter (HOSPITAL_BASED_OUTPATIENT_CLINIC_OR_DEPARTMENT_OTHER)
Admission: RE | Disposition: A | Discharge status: Home / Self Care | Payer: Self-pay | Source: Home / Self Care | Attending: Surgery

## 2019-09-27 ENCOUNTER — Ambulatory Visit (HOSPITAL_BASED_OUTPATIENT_CLINIC_OR_DEPARTMENT_OTHER)
Admission: RE | Admit: 2019-09-27 | Discharge: 2019-09-27 | Disposition: A | Discharge status: Home / Self Care | Payer: BLUE CROSS/BLUE SHIELD | Attending: Surgery | Admitting: Surgery

## 2019-09-27 ENCOUNTER — Ambulatory Visit (HOSPITAL_BASED_OUTPATIENT_CLINIC_OR_DEPARTMENT_OTHER): Payer: BLUE CROSS/BLUE SHIELD | Admitting: Anesthesiology

## 2019-09-27 ENCOUNTER — Encounter (HOSPITAL_BASED_OUTPATIENT_CLINIC_OR_DEPARTMENT_OTHER): Payer: Self-pay | Admitting: Surgery

## 2019-09-27 DIAGNOSIS — K4090 Unilateral inguinal hernia, without obstruction or gangrene, not specified as recurrent: Secondary | ICD-10-CM | POA: Diagnosis present

## 2019-09-27 HISTORY — PX: INGUINAL HERNIA REPAIR: SHX194

## 2019-09-27 SURGERY — REPAIR, HERNIA, INGUINAL, ADULT
Anesthesia: General | Site: Groin | Laterality: Right

## 2019-09-27 MED ORDER — LIDOCAINE HCL (CARDIAC) PF 100 MG/5ML IV SOSY
PREFILLED_SYRINGE | INTRAVENOUS | Status: DC | PRN
  Administered 2019-09-27: 100 mg via INTRAVENOUS

## 2019-09-27 MED ORDER — FENTANYL CITRATE (PF) 100 MCG/2ML IJ SOLN
50.0000 ug | INTRAMUSCULAR | Status: DC | PRN
  Administered 2019-09-27: 100 ug via INTRAVENOUS

## 2019-09-27 MED ORDER — CEFAZOLIN SODIUM-DEXTROSE 2-4 GM/100ML-% IV SOLN
INTRAVENOUS | Status: AC
  Filled 2019-09-27: qty 100

## 2019-09-27 MED ORDER — BUPIVACAINE LIPOSOME 1.3 % IJ SUSP: 20.0000 mL | Freq: Once | INTRAMUSCULAR | Status: DC

## 2019-09-27 MED ORDER — OXYCODONE HCL 5 MG PO TABS: 5.0000 mg | ORAL_TABLET | Freq: Once | ORAL | Status: DC | PRN

## 2019-09-27 MED ORDER — EPHEDRINE SULFATE 50 MG/ML IJ SOLN
INTRAMUSCULAR | Status: DC | PRN
  Administered 2019-09-27: 10 mg via INTRAVENOUS

## 2019-09-27 MED ORDER — SODIUM CHLORIDE (PF) 0.9 % IJ SOLN
INTRAMUSCULAR | Status: AC
  Filled 2019-09-27: qty 30

## 2019-09-27 MED ORDER — DEXAMETHASONE SODIUM PHOSPHATE 10 MG/ML IJ SOLN
INTRAMUSCULAR | Status: DC | PRN
  Administered 2019-09-27: 6 mg via INTRAVENOUS

## 2019-09-27 MED ORDER — ONDANSETRON HCL 4 MG/2ML IJ SOLN
4.0000 mg | Freq: Once | INTRAMUSCULAR | Status: AC | PRN
  Administered 2019-09-27: 10:00:00 4 mg via INTRAVENOUS

## 2019-09-27 MED ORDER — PROPOFOL 10 MG/ML IV BOLUS
INTRAVENOUS | Status: DC | PRN
  Administered 2019-09-27: 200 mg via INTRAVENOUS

## 2019-09-27 MED ORDER — SODIUM CHLORIDE 0.9 % IV SOLN
INTRAVENOUS | Status: DC | PRN
  Administered 2019-09-27: 10:00:00

## 2019-09-27 MED ORDER — HYDROMORPHONE HCL 1 MG/ML IJ SOLN: 0.2500 mg | INTRAMUSCULAR | Status: DC | PRN

## 2019-09-27 MED ORDER — FENTANYL CITRATE (PF) 100 MCG/2ML IJ SOLN
INTRAMUSCULAR | Status: AC
  Filled 2019-09-27: qty 2

## 2019-09-27 MED ORDER — EPHEDRINE 5 MG/ML INJ
INTRAVENOUS | Status: AC
  Filled 2019-09-27: qty 10

## 2019-09-27 MED ORDER — BUPIVACAINE HCL (PF) 0.25 % IJ SOLN
INTRAMUSCULAR | Status: DC | PRN
  Administered 2019-09-27: 5 mL

## 2019-09-27 MED ORDER — ONDANSETRON HCL 4 MG/2ML IJ SOLN
INTRAMUSCULAR | Status: AC
  Filled 2019-09-27: qty 10

## 2019-09-27 MED ORDER — HYDROCODONE-ACETAMINOPHEN 5-325 MG PO TABS: 1.0000 | ORAL_TABLET | Freq: Four times a day (QID) | ORAL | 0 refills | Status: DC | PRN

## 2019-09-27 MED ORDER — PROPOFOL 10 MG/ML IV BOLUS
INTRAVENOUS | Status: AC
  Filled 2019-09-27: qty 40

## 2019-09-27 MED ORDER — MIDAZOLAM HCL 2 MG/2ML IJ SOLN
INTRAMUSCULAR | Status: AC
  Filled 2019-09-27: qty 2

## 2019-09-27 MED ORDER — CHLORHEXIDINE GLUCONATE CLOTH 2 % EX PADS: 6.0000 | MEDICATED_PAD | Freq: Once | CUTANEOUS | Status: DC

## 2019-09-27 MED ORDER — KETOROLAC TROMETHAMINE 30 MG/ML IJ SOLN: 30.0000 mg | Freq: Once | INTRAMUSCULAR | Status: DC | PRN

## 2019-09-27 MED ORDER — DEXAMETHASONE SODIUM PHOSPHATE 10 MG/ML IJ SOLN
INTRAMUSCULAR | Status: AC
  Filled 2019-09-27: qty 3

## 2019-09-27 MED ORDER — HEPARIN SODIUM (PORCINE) 5000 UNIT/ML IJ SOLN
INTRAMUSCULAR | Status: AC
  Filled 2019-09-27: qty 1

## 2019-09-27 MED ORDER — ACETAMINOPHEN 500 MG PO TABS
1000.0000 mg | ORAL_TABLET | ORAL | Status: AC
  Administered 2019-09-27: 1000 mg via ORAL

## 2019-09-27 MED ORDER — HEPARIN SODIUM (PORCINE) 5000 UNIT/ML IJ SOLN: 5000.0000 [IU] | Freq: Once | INTRAMUSCULAR | Status: DC

## 2019-09-27 MED ORDER — LIDOCAINE 2% (20 MG/ML) 5 ML SYRINGE
INTRAMUSCULAR | Status: AC
  Filled 2019-09-27: qty 10

## 2019-09-27 MED ORDER — OXYCODONE HCL 5 MG/5ML PO SOLN: 5.0000 mg | Freq: Once | ORAL | Status: DC | PRN

## 2019-09-27 MED ORDER — LACTATED RINGERS IV SOLN
INTRAVENOUS | Status: DC
  Administered 2019-09-27: 09:00:00 via INTRAVENOUS

## 2019-09-27 MED ORDER — ACETAMINOPHEN 500 MG PO TABS
ORAL_TABLET | ORAL | Status: AC
  Filled 2019-09-27: qty 2

## 2019-09-27 MED ORDER — DEXMEDETOMIDINE HCL IN NACL 200 MCG/50ML IV SOLN
INTRAVENOUS | Status: DC | PRN
  Administered 2019-09-27: 12 ug via INTRAVENOUS

## 2019-09-27 MED ORDER — MIDAZOLAM HCL 2 MG/2ML IJ SOLN
1.0000 mg | INTRAMUSCULAR | Status: DC | PRN
  Administered 2019-09-27: 2 mg via INTRAVENOUS

## 2019-09-27 MED ORDER — CEFAZOLIN SODIUM-DEXTROSE 2-4 GM/100ML-% IV SOLN
2.0000 g | INTRAVENOUS | Status: AC
  Administered 2019-09-27: 09:00:00 2 g via INTRAVENOUS

## 2019-09-27 SURGICAL SUPPLY — 45 items
ADH SKN CLS APL DERMABOND .7 (GAUZE/BANDAGES/DRESSINGS) ×1
BLADE CLIPPER SURG (BLADE) ×2 IMPLANT
BLADE SURG 15 STRL LF DISP TIS (BLADE) ×2 IMPLANT
BLADE SURG 15 STRL SS (BLADE) ×3
CANISTER SUCT 1200ML W/VALVE (MISCELLANEOUS) ×3 IMPLANT
COVER BACK TABLE REUSABLE LG (DRAPES) ×3 IMPLANT
COVER MAYO STAND REUSABLE (DRAPES) ×3 IMPLANT
COVER WAND RF STERILE (DRAPES) ×3 IMPLANT
DECANTER SPIKE VIAL GLASS SM (MISCELLANEOUS) ×1 IMPLANT
DERMABOND ADVANCED (GAUZE/BANDAGES/DRESSINGS) ×2
DERMABOND ADVANCED .7 DNX12 (GAUZE/BANDAGES/DRESSINGS) ×1 IMPLANT
DRAIN PENROSE 1/2X12 LTX STRL (WOUND CARE) ×3 IMPLANT
DRAPE LAPAROTOMY T 102X78X121 (DRAPES) ×3 IMPLANT
DRAPE UTILITY XL STRL (DRAPES) ×3 IMPLANT
ELECT COATED BLADE 2.86 ST (ELECTRODE) ×1 IMPLANT
ELECT REM PT RETURN 9FT ADLT (ELECTROSURGICAL) ×3
ELECTRODE REM PT RTRN 9FT ADLT (ELECTROSURGICAL) ×1 IMPLANT
GLOVE BIO SURGEON STRL SZ7.5 (GLOVE) ×2 IMPLANT
GLOVE BIO SURGEON STRL SZ8 (GLOVE) ×5 IMPLANT
GLOVE BIOGEL PI IND STRL 7.5 (GLOVE) IMPLANT
GLOVE BIOGEL PI INDICATOR 7.5 (GLOVE) ×2
GOWN STRL REUS W/ TWL LRG LVL3 (GOWN DISPOSABLE) ×1 IMPLANT
GOWN STRL REUS W/ TWL XL LVL3 (GOWN DISPOSABLE) ×1 IMPLANT
GOWN STRL REUS W/TWL LRG LVL3 (GOWN DISPOSABLE) ×6
GOWN STRL REUS W/TWL XL LVL3 (GOWN DISPOSABLE) ×3
MESH HERNIA 3X6 (Mesh General) ×2 IMPLANT
NDL HYPO 25X1 1.5 SAFETY (NEEDLE) ×1 IMPLANT
NEEDLE HYPO 25X1 1.5 SAFETY (NEEDLE) ×6 IMPLANT
NS IRRIG 1000ML POUR BTL (IV SOLUTION) ×3 IMPLANT
PACK BASIN DAY SURGERY FS (CUSTOM PROCEDURE TRAY) ×3 IMPLANT
PENCIL SMOKE EVACUATOR (MISCELLANEOUS) ×3 IMPLANT
SUT MON AB 5-0 PS2 18 (SUTURE) ×3 IMPLANT
SUT PROLENE 2 0 CT2 30 (SUTURE) ×6 IMPLANT
SUT SILK 2 0 SH (SUTURE) ×2 IMPLANT
SUT VIC AB 2-0 SH 27 (SUTURE) ×3
SUT VIC AB 2-0 SH 27XBRD (SUTURE) ×1 IMPLANT
SUT VIC AB 4-0 SH 18 (SUTURE) ×3 IMPLANT
SYR 20ML LL LF (SYRINGE) ×2 IMPLANT
SYR BULB 3OZ (MISCELLANEOUS) ×3 IMPLANT
SYR CONTROL 10ML LL (SYRINGE) ×5 IMPLANT
TOWEL GREEN STERILE FF (TOWEL DISPOSABLE) ×3 IMPLANT
TRAY DSU PREP LF (CUSTOM PROCEDURE TRAY) ×3 IMPLANT
TUBE CONNECTING 20'X1/4 (TUBING) ×1
TUBE CONNECTING 20X1/4 (TUBING) ×2 IMPLANT
YANKAUER SUCT BULB TIP NO VENT (SUCTIONS) ×3 IMPLANT

## 2019-09-27 NOTE — Anesthesia Postprocedure Evaluation (Signed)
Anesthesia Post Note  Patient: Randy Hess  Procedure(s) Performed: OPEN RIGHT INGUINAL HERNIA REPAIR (Right Groin)     Patient location during evaluation: PACU Anesthesia Type: General Level of consciousness: awake and alert Pain management: pain level controlled Vital Signs Assessment: post-procedure vital signs reviewed and stable Respiratory status: spontaneous breathing, nonlabored ventilation, respiratory function stable and patient connected to nasal cannula oxygen Cardiovascular status: blood pressure returned to baseline and stable Postop Assessment: no apparent nausea or vomiting Anesthetic complications: no    Last Vitals:  Vitals:   09/27/19 1045 09/27/19 1100  BP: 115/73 119/73  Pulse: 82 82  Resp: 11 16  Temp:  36.7 C  SpO2: 97% 100%    Last Pain:  Vitals:   09/27/19 1100  TempSrc:   PainSc: 3                  Barnet Glasgow

## 2019-09-27 NOTE — Op Note (Signed)
Randy Hess  [birth year] 27 September 2019    PCP:  Emeterio Reeve, DO   Surgeon: Kaylyn Lim, MD, FACS  Asst:  Dr. Reather Laurence, MD  Anes:  general  Preop Dx: Right Inguinal hernia Postop Dx: Right indirect inguinal hernia  Procedure: Open right inguinal hernia repair with mesh Location Surgery: CDS 8 Complications: none  EBL:   minimal cc  Drains: none  Description of Procedure:  The patient was taken to OR 8 .  After anesthesia was administered and the patient was prepped  with Chloroprep and a timeout was performed.  The right side had been marked and an incision was made obliquely.  This was carried down to the external oblique which were then incised to open down to the external ring.  The cord was mobilized and on the anteromedial aspect of the cord there was a sac that dissected nicely from the cord structures.  The inside was palpated and then a high ligation was performed with a 2-0 silk.   A piece of marlex type mesh was cut to fit and sewn in to the floor along the inguinal ligament and medially into the internal oblique and transversalis with running 2-0 Prolene.  A mattress suture of prolene was used to sew the two arms of the mesh around the cord.  This was not too tight.  The external oblique was closed with running 2-0 vicryl and then 20 cc Exparel was injected.  Irrigation and closure in layers with 4-0 vicryl and 4-0 Monocryl.    The patient tolerated the procedure well and was taken to the PACU in stable condition.     [name] Done, [place], [facility], [place]

## 2019-09-27 NOTE — Discharge Instructions (Signed)
Inguinal Hernia, Adult °An inguinal hernia is when fat or your intestines push through a weak spot in a muscle where your leg meets your lower belly (groin). This causes a rounded lump (bulge). This kind of hernia could also be: °· In your scrotum, if you are male. °· In folds of skin around your vagina, if you are male. °There are three types of inguinal hernias. These include: °· Hernias that can be pushed back into the belly (are reducible). This type rarely causes pain. °· Hernias that cannot be pushed back into the belly (are incarcerated). °· Hernias that cannot be pushed back into the belly and lose their blood supply (are strangulated). This type needs emergency surgery. °If you do not have symptoms, you may not need treatment. If you have symptoms or a large hernia, you may need surgery. °Follow these instructions at home: °Lifestyle °· Do these things if told by your doctor so you do not have trouble pooping (constipation): °? Drink enough fluid to keep your pee (urine) pale yellow. °? Eat foods that have a lot of fiber. These include fresh fruits and vegetables, whole grains, and beans. °? Limit foods that are high in fat and processed sugars. These include foods that are fried or sweet. °? Take medicine for trouble pooping. °· Avoid lifting heavy objects. °· Avoid standing for long amounts of time. °· Do not use any products that contain nicotine or tobacco. These include cigarettes and e-cigarettes. If you need help quitting, ask your doctor. °· Stay at a healthy weight. °General instructions °· You may try to push your hernia in by very gently pressing on it when you are lying down. Do not try to force the bulge back in if it will not push in easily. °· Watch your hernia for any changes in shape, size, or color. Tell your doctor if you see any changes. °· Take over-the-counter and prescription medicines only as told by your doctor. °· Keep all follow-up visits as told by your doctor. This is  important. °Contact a doctor if: °· You have a fever. °· You have new symptoms. °· Your symptoms get worse. °Get help right away if: °· The area where your leg meets your lower belly has: °? Pain that gets worse suddenly. °? A bulge that gets bigger suddenly, and it does not get smaller after that. °? A bulge that turns red or purple. °? A bulge that is painful when you touch it. °· You are a man, and your scrotum: °? Suddenly feels painful. °? Suddenly changes in size. °· You cannot push the hernia in by very gently pressing on it when you are lying down. Do not try to force the bulge back in if it will not push in easily. °· You feel sick to your stomach (nauseous), and that feeling does not go away. °· You throw up (vomit), and that keeps happening. °· You have a fast heartbeat. °· You cannot poop (have a bowel movement) or pass gas. °These symptoms may be an emergency. Do not wait to see if the symptoms will go away. Get medical help right away. Call your local emergency services (911 in the [place] °Summary °· An inguinal hernia is when fat or your intestines push through a weak spot in a muscle where your leg meets your lower belly (groin). This causes a rounded lump (bulge). °· If you do not have symptoms, you may not need treatment. If you have symptoms or a large hernia, you   may need surgery.  Avoid lifting heavy objects. Also avoid standing for long amounts of time.  Do not try to force the bulge back in if it will not push in easily. This information is not intended to replace advice given to you by your health care provider. Make sure you discuss any questions you have with your health care provider. Document Released: 11/03/2006 Document Revised: 11/04/2017 Document Reviewed: 07/05/2017 Elsevier Patient Education  [place] Instructions  Activity: Get plenty of rest for the remainder of the day. A responsible individual must stay with you for 24 hours  following the procedure.  For the next 24 hours, DO NOT: -Drive a car -Paediatric nurse -Drink alcoholic beverages -Take any medication unless instructed by your physician -Make any legal decisions or sign important papers.  Meals: Start with liquid foods such as gelatin or soup. Progress to regular foods as tolerated. Avoid greasy, spicy, heavy foods. If nausea and/or vomiting occur, drink only clear liquids until the nausea and/or vomiting subsides. Call your physician if vomiting continues.  Special Instructions/Symptoms: Your throat may feel dry or sore from the anesthesia or the breathing tube placed in your throat during surgery. If this causes discomfort, gargle with warm salt water. The discomfort should disappear within 24 hours.  If you had a scopolamine patch placed behind your ear for the management of post- operative nausea and/or vomiting:  1. The medication in the patch is effective for 72 hours, after which it should be removed.  Wrap patch in a tissue and discard in the trash. Wash hands thoroughly with soap and water. 2. You may remove the patch earlier than 72 hours if you experience unpleasant side effects which may include dry mouth, dizziness or visual disturbances. 3. Avoid touching the patch. Wash your hands with soap and water after contact with the patch.  No tylenol until after 2pm

## 2019-09-27 NOTE — Transfer of Care (Signed)
Immediate Anesthesia Transfer of Care Note  Patient: Randy Hess  Procedure(s) Performed: OPEN RIGHT INGUINAL HERNIA REPAIR (Right Groin)  Patient Location: PACU  Anesthesia Type:General  Level of Consciousness: awake, alert , oriented and patient cooperative  Airway & Oxygen Therapy: Patient Spontanous Breathing  Post-op Assessment: Report given to RN, Post -op Vital signs reviewed and stable and Patient moving all extremities X 4  Post vital signs: Reviewed and stable  Last Vitals:  Vitals Value Taken Time  BP 118/76 09/27/19 1021  Temp    Pulse 88 09/27/19 1022  Resp    SpO2 98 % 09/27/19 1022  Vitals shown include unvalidated device data.  Last Pain:  Vitals:   09/27/19 0744  TempSrc: Oral  PainSc: 0-No pain      Patients Stated Pain Goal: 4 (85/88/50 2774)  Complications: No apparent anesthesia complications

## 2019-09-27 NOTE — Interval H&P Note (Signed)
History and Physical Interval Note:  09/27/2019 8:34 AM  Randy Hess  has presented today for surgery, with the diagnosis of RIGHT INGUINAL HERNIA.  The various methods of treatment have been discussed with the patient and family. After consideration of risks, benefits and other options for treatment, the patient has consented to  Procedure(s): OPEN RIGHT INGUINAL HERNIA REPAIR POSSIBLE BILATERAL (N/A) as a surgical intervention.  The patient's history has been reviewed, patient examined, no change in status, stable for surgery.  I have reviewed the patient's chart and labs.  Questions were answered to the patient's satisfaction.     Pedro Earls

## 2019-09-27 NOTE — Anesthesia Procedure Notes (Signed)
Procedure Name: LMA Insertion Date/Time: 09/27/2019 8:57 AM Performed by: Jonna Munro, CRNA Pre-anesthesia Checklist: Patient identified, Emergency Drugs available, Suction available, Patient being monitored and Timeout performed Patient Re-evaluated:Patient Re-evaluated prior to induction Oxygen Delivery Method: Circle system utilized Preoxygenation: Pre-oxygenation with 100% oxygen Induction Type: IV induction LMA: LMA inserted LMA Size: 5.0 Number of attempts: 1 Placement Confirmation: positive ETCO2 and breath sounds checked- equal and bilateral Dental Injury: Teeth and Oropharynx as per pre-operative assessment

## 2019-09-30 ENCOUNTER — Encounter: Payer: Self-pay | Admitting: *Deleted

## 2019-11-08 ENCOUNTER — Ambulatory Visit (INDEPENDENT_AMBULATORY_CARE_PROVIDER_SITE_OTHER): Payer: BLUE CROSS/BLUE SHIELD

## 2019-11-08 ENCOUNTER — Other Ambulatory Visit: Payer: Self-pay

## 2019-11-08 ENCOUNTER — Ambulatory Visit (INDEPENDENT_AMBULATORY_CARE_PROVIDER_SITE_OTHER): Payer: BLUE CROSS/BLUE SHIELD | Admitting: Sports Medicine

## 2019-11-08 ENCOUNTER — Ambulatory Visit: Payer: BLUE CROSS/BLUE SHIELD

## 2019-11-08 ENCOUNTER — Telehealth: Payer: Self-pay | Admitting: Osteopathic Medicine

## 2019-11-08 DIAGNOSIS — S43005A Unspecified dislocation of left shoulder joint, initial encounter: Secondary | ICD-10-CM

## 2019-11-08 MED ORDER — HYDROCODONE-ACETAMINOPHEN 10-325 MG PO TABS: 1.0000 | ORAL_TABLET | Freq: Three times a day (TID) | ORAL | 0 refills | Status: DC | PRN

## 2019-11-08 NOTE — Progress Notes (Addendum)
    Procedures performed today:    Procedure: Shoulder dislocation reduction   Risks, benefits, and alternatives explained and consent obtained. Time out conducted. Surface prepped with alcohol. 5 cc of lidocaine and 5 cc bupivacaine infiltrated into the glenohumeral joint Adequate anesthesia ensured. I applied axial traction and gentle abduction and external rotation of the shoulder, I then applied a laterally directed force and the shoulder reduced itself. The patient's left upper extremity was intact neurovascularly, axillary nerve distribution was intact.  Ulnar and radial artery pulses were intact. Post reduction films obtained showed anatomic/near-anatomic alignment. Pt stable, aftercare and follow-up advised.  Independent interpretation of tests performed by another provider:   None.  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 He was skiing this morning at [facility], he fell and the shoulder was brought into abduction and external rotation and dislocated. He then drove here for treatment. Today we performed a closed reduction with local anesthesia. He will go downstairs for postreduction images, hydrocodone for pain, he will need an MRI. He will wear his sling for the next 2 weeks. We will start physical therapy in about 2 weeks, he will let me know where he wants to do it. He can return to see me in 1 month.  There is a good-sized minimally displaced fracture at the inferior glenoid, as well as tear of the inferior labrum.  There is also partial tearing of the subscapularis muscle of the rotator cuff.  I would like a surgical opinion from Dr. Everardo Pacific, if he is not going to be here in [place] for long then we may consider finding a shoulder surgeon in [place] just for a consultation.  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    ___________________________________________ Randy Hess. Randy Hess, M.D., ABFM., CAQSM. Primary Care and Sports Medicine [facility]  Adjunct Instructor of Family Medicine  [employer]

## 2019-11-08 NOTE — Telephone Encounter (Addendum)
Please call patient to schedule nurse visit for shingrix! THanks!

## 2019-11-08 NOTE — Assessment & Plan Note (Addendum)
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 He was skiing this morning at [facility], he fell and the shoulder was brought into abduction and external rotation and dislocated. He then drove here for treatment. Today we performed a closed reduction with local anesthesia. He will go downstairs for postreduction images, hydrocodone for pain, he will need an MRI. He will wear his sling for the next 2 weeks. We will start physical therapy in about 2 weeks, he will let me know where he wants to do it. He can return to see me in 1 month.  There is a good-sized minimally displaced fracture at the inferior glenoid, as well as tear of the inferior labrum.  There is also partial tearing of the subscapularis muscle of the rotator cuff.  I would like a surgical opinion from Dr. Everardo Pacific, if he is not going to be here in [place] for long then we may consider finding a shoulder surgeon in [place] just for a consultation.  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

## 2019-11-10 ENCOUNTER — Other Ambulatory Visit: Payer: Self-pay

## 2019-11-10 ENCOUNTER — Ambulatory Visit (INDEPENDENT_AMBULATORY_CARE_PROVIDER_SITE_OTHER): Payer: BLUE CROSS/BLUE SHIELD

## 2019-11-10 DIAGNOSIS — S43005A Unspecified dislocation of left shoulder joint, initial encounter: Secondary | ICD-10-CM | POA: Diagnosis not present

## 2019-11-11 DIAGNOSIS — S43005A Unspecified dislocation of left shoulder joint, initial encounter: Secondary | ICD-10-CM

## 2019-11-15 ENCOUNTER — Ambulatory Visit (INDEPENDENT_AMBULATORY_CARE_PROVIDER_SITE_OTHER): Payer: BLUE CROSS/BLUE SHIELD | Admitting: Osteopathic Medicine

## 2019-11-15 ENCOUNTER — Ambulatory Visit (INDEPENDENT_AMBULATORY_CARE_PROVIDER_SITE_OTHER): Payer: BLUE CROSS/BLUE SHIELD | Admitting: Rehabilitative and Restorative Service Providers"

## 2019-11-15 ENCOUNTER — Other Ambulatory Visit: Payer: Self-pay

## 2019-11-15 ENCOUNTER — Encounter: Payer: Self-pay | Admitting: Rehabilitative and Restorative Service Providers"

## 2019-11-15 VITALS — Temp 98.1°F

## 2019-11-15 DIAGNOSIS — R29898 Other symptoms and signs involving the musculoskeletal system: Secondary | ICD-10-CM | POA: Diagnosis not present

## 2019-11-15 DIAGNOSIS — M6281 Muscle weakness (generalized): Secondary | ICD-10-CM

## 2019-11-15 DIAGNOSIS — Z23 Encounter for immunization: Secondary | ICD-10-CM

## 2019-11-15 DIAGNOSIS — M25512 Pain in left shoulder: Secondary | ICD-10-CM | POA: Diagnosis not present

## 2019-11-15 NOTE — Progress Notes (Signed)
Pt in today for first Shingrix injection.  Advised him to return in 60 days for the second dose.

## 2019-11-15 NOTE — Therapy (Signed)
[facility] [place] [address] [place], [place], [address] Phone: [phone]   Fax:  [phone]  Physical Therapy Evaluation  Patient Details  Name: Randy Hess MRN: [id number] Date of Birth: 06-Oct-1971 Referring Provider (PT): Ramond Marrow, MD   Encounter Date: 11/15/2019  PT End of Session - 11/15/19 2014    Visit Number  1    Number of Visits  12    Date for PT Re-Evaluation  02/13/20    PT Start Time  1530    PT Stop Time  1615    PT Time Calculation (min)  45 min    Activity Tolerance  Patient tolerated treatment well;No increased pain    Behavior During Therapy  WFL for tasks assessed/performed       Past Medical History:  Diagnosis Date  . History of shingles 04/22/2016    Past Surgical History:  Procedure Laterality Date  . INGUINAL HERNIA REPAIR Right 09/27/2019   Procedure: OPEN RIGHT INGUINAL HERNIA REPAIR;  Surgeon: Luretha Murphy, MD;  Location: [facility];  Service: General;  Laterality: Right;  . NO PAST SURGERIES      There were no vitals filed for this visit.   Subjective Assessment - 11/15/19 1542    Subjective  The patient sustained a L shoulder dislocation last Friday 1/22 while snow skiing.  His L shoulder was reduced by Dr. Velva Harman.  The patient wore a sling for 2 days (was recommended for 2 weeks, but he felt comfortable without it).  He is using his arm within small ranges and gets minimal discomfort when rolling over at night.    Diagnostic tests  1. Tear of the anterior inferior labrum with a fracture of theanterior inferior glenoid with mild anterior inferior displacementof the fracture fragment. Focal concavity with adjacent subcorticalmarrow edema involving the superior posterolateral humeral headconsistent with a Hill-Sachs lesion.2. Mild tendinosis of the supraspinatus tendon.3. Moderate tendinosis of the subscapularis tendon with apartial-thickness tear.4. Muscle edema  in the superior aspect of the subscapularis musclemost consistent with a small partial tear at the musculotendinousjunction.    Patient Stated Goals  Return to full functional use of the left shoulder    Currently in Pain?  No/denies   Notes discomfort and apprehension with specific movements        [facility] PT Assessment - 11/15/19 1547      Assessment   Medical Diagnosis  left shoulder dislocation    Referring Provider (PT)  Ramond Marrow, MD    Onset Date/Surgical Date  11/07/18    Hand Dominance  Right    Next MD Visit  6 weeks    Prior Therapy  none      Precautions   Precautions  Shoulder    Type of Shoulder Precautions  avoid abduction with external rotation      Restrictions   Weight Bearing Restrictions  No      Balance Screen   Has the patient fallen in the past 6 months  Yes    How many times?  injury during skiing    Has the patient had a decrease in activity level because of a fear of falling?   No    Is the patient reluctant to leave their home because of a fear of falling?   No      Home Public house manager residence      Prior Function   Level of Independence  Independent    Vocation  Full time employment    Vocation Requirements  biology professor    Leisure  exercises regularly      Presenter, broadcasting Intact      Posture/Postural Control   Posture/Postural Control  No significant limitations      ROM / Strength   AROM / PROM / Strength  AROM;Strength      AROM   Overall AROM   Deficits    Overall AROM Comments  L UE shoulder AROM not fully assessed due to only 1 week post traumatic dislocation of the shoulder.  PT had patient demonstrate movement within comfortable ROM using AAROM with cane doing supine shoulder press.  Performed AAROM into shoulder flexion to 100 degrees with AAROM cane.  Patient's R shoulder is WFL without restriction.    AROM Assessment Site  Shoulder    Right/Left Shoulder  --      Strength    Overall Strength  Deficits    Overall Strength Comments  No MMT performed due to recent traumatic dislocation.        Palpation   Palpation comment  Tenderness to palpation along anterior deltoid and pectoralis major musculature      Special Tests   Other special tests  *Apprehension noted with scaption to 30 deg with minimal ER.  *Avoiding abduction and ER                Objective measurements completed on examination: See above findings.      [facility] Adult PT Treatment/Exercise - 11/15/19 1547      Self-Care   Self-Care  Other Self-Care Comments    Other Self-Care Comments   *PT and patient discussed use of sling.  Recommended:  1 ) Nighttime use of sling due to unpredictable movements 2) use of sling during upcoming travel for field research for biology to protect shoulder.  Discussed ice pack use anteriorly x 20 minutes as needed.       Exercises   Exercises  Shoulder      Shoulder Exercises: Supine   Other Supine Exercises  Shoulder press with dowel staying within comfortable ROM.      Shoulder Exercises: Standing   Extension  Strengthening;Left;10 reps    Extension Limitations  red theraband with scapular retraction, moving through small ROM (from 20 deg flexion to neutral) emphasizing scapular retraction and stabilizatio    Other Standing Exercises  Elbow flexion with 3 lb weight L UE x 10+ reps (patient continued to perform during discussin of HEP)      Shoulder Exercises: Isometric Strengthening   Flexion  --   5 second holds x 5 reps   ABduction  --   5 second holds x 5 reps              PT Short Term Goals - 11/15/19 1952      PT SHORT TERM GOAL #1   Title  The patient will be indep with HEP for L shoulder isometrics, gentle ROM, and scapular stabilization.    Time  6    Period  Weeks    Target Date  12/30/19      PT SHORT TERM GOAL #2   Title  The patient will have full PROM of the L shoulder *ER to only be performed in plane of scapula  avoiding abd/ER*    Baseline  PROM not fully assessed as PT emphasized stabilization at neutral.    Time  6    Period  Weeks    Target Date  12/30/19      PT SHORT TERM GOAL #3   Title  The patient will have AROM in standing to 120 degrees flexion and scaption.    Time  6    Period  Weeks    Target Date  12/30/19      PT SHORT TERM GOAL #4   Title  --        PT Long Term Goals - 11/15/19 1959      PT LONG TERM GOAL #1   Title  The patient will be indep with HEP progression.    Time  12    Period  Weeks    Target Date  02/13/20      PT LONG TERM GOAL #2   Title  The patient will tolerate functional strengthening program tolerating push up x 10 reps without pain.    Time  12    Period  Weeks    Target Date  02/13/20      PT LONG TERM GOAL #3   Title  The patient will have full AROM L shoulder for flexion, abduction, ER, IR.    Time  12    Period  Weeks    Target Date  02/13/20      PT LONG TERM GOAL #4   Title  The patient will verbalize understanding of appropriate gym routine (avoid overhead press and wide arm bench press until cleared by MD) for return to community exercise.    Time  12    Period  Weeks    Target Date  02/13/20             Plan - 11/15/19 2005    Clinical Impression Statement  The patient is a 49 year old male s/p traumatic dislocation L shoulder on 11/08/2019.  PT avoided full ROM and MMT assessment due to acuity of injury to allow tissue healing.  Patient has decreased ROM, decreased strength, apprehension with minimal ER (scaption to 10 deg), mild tenderness anterior shoulder to palpation and some discomfort noted in suprspinatus region with isometric abduction.  Today's focus was to begin gentle movement of the L shoulder with AAROM and isometric strengthening to initiate stability at neutral position.  PT also recomended return to sling during sleep and during travel to avoid reflexive lifting of bags or ER movements.  PT to progress the  patient to tolerance avoiding ER and abduction at this time.  LTGs set for 12 weeks as the patient resides in [place] for work and anticipates attending PT sessions 2-3 times/month.  He appears compliant and PT will emphasize instruction in HEP due to length of time between PT visits.    Examination-Activity Limitations  Lift;Reach Overhead    Examination-Participation Restrictions  Cleaning    Stability/Clinical Decision Making  Stable/Uncomplicated    Clinical Decision Making  Low    Rehab Potential  Good    PT Frequency  1x / week    PT Duration  12 weeks    PT Treatment/Interventions  ADLs/Self Care Home Management;Electrical Stimulation;Iontophoresis 4mg /ml Dexamethasone;Moist Heat;Ultrasound;Neuromuscular re-education;Therapeutic exercise;Therapeutic activities;Patient/family education;Taping;Passive range of motion;Dry needling;Manual techniques    PT Next Visit Plan  Progress HEP, begin AAROM cane over 90 degrees of flexion if tolerated, AAROM cane shoulder extension standing, scapular stabilization exercises, isometric strengthening.  *AVOID ABD/ER; progress isotonic strengthening in plane of scapula as tolerated.    PT Home Exercise Plan  Access Code:    Consulted and Agree  with Plan of Care  Patient       Patient will benefit from skilled therapeutic intervention in order to improve the following deficits and impairments:  Pain, Hypermobility, Decreased strength, Decreased range of motion, Impaired flexibility  Visit Diagnosis: No diagnosis found.     Problem List Patient Active Problem List   Diagnosis Date Noted  . Shoulder dislocation, left, initial encounter 11/08/2019  . Inguinal hernia 07/19/2019  . Rash and nonspecific skin eruption 10/05/2016  . History of shingles 04/22/2016    [place], PT 11/15/2019, 8:15 PM  [facility] [place] [address] [place], [place], [address] Phone: [phone]    Fax:  [phone]  Name: Antolin Belsito MRN: [id number] Date of Birth: May 07, 1971

## 2019-11-15 NOTE — Patient Instructions (Signed)
Access Code: [email or web address]  URL: [email or web address]  Date: 11/15/2019  Prepared by: Margretta Ditty   Exercises Standing Bicep Curls Supinated with Dumbbells - 10 reps - 1 sets                            - 2x daily - 7x weekly Supine Shoulder Press with Dowel - 10 reps - 1 sets - 2x daily - 7x weekly Isometric Shoulder Flexion at Wall - 10 reps - 1 sets - 2x daily - 7x weekly Isometric Shoulder Abduction at Wall - 10 reps - 1 sets - 2x daily - 7x weekly Single Arm Shoulder Extension with Anchored Resistance - 10 reps - 1 sets - 2x daily - 7x weekly

## 2019-11-19 NOTE — Telephone Encounter (Signed)
Pt completed first Shingrix injection on 11/15/19.

## 2019-11-29 ENCOUNTER — Ambulatory Visit (INDEPENDENT_AMBULATORY_CARE_PROVIDER_SITE_OTHER): Payer: BLUE CROSS/BLUE SHIELD | Admitting: Physical Therapy

## 2019-11-29 ENCOUNTER — Other Ambulatory Visit: Payer: Self-pay

## 2019-11-29 DIAGNOSIS — M25512 Pain in left shoulder: Secondary | ICD-10-CM | POA: Diagnosis not present

## 2019-11-29 DIAGNOSIS — M6281 Muscle weakness (generalized): Secondary | ICD-10-CM

## 2019-11-29 DIAGNOSIS — R29898 Other symptoms and signs involving the musculoskeletal system: Secondary | ICD-10-CM

## 2019-11-29 NOTE — Therapy (Signed)
[facility] [place] [address] [place], [place], [address] Phone: [phone]   Fax:  [phone]  Physical Therapy Treatment  Patient Details  Name: Randy Hess MRN: [id number] Date of Birth: 1971/09/26 Referring Provider (PT): Ramond Marrow, MD   Encounter Date: 11/29/2019  PT End of Session - 11/29/19 1406    Visit Number  2    Number of Visits  12    Date for PT Re-Evaluation  02/13/20    PT Start Time  1402    PT Stop Time  1449    PT Time Calculation (min)  47 min    Activity Tolerance  Patient tolerated treatment well;No increased pain    Behavior During Therapy  WFL for tasks assessed/performed       Past Medical History:  Diagnosis Date  . History of shingles 04/22/2016    Past Surgical History:  Procedure Laterality Date  . INGUINAL HERNIA REPAIR Right 09/27/2019   Procedure: OPEN RIGHT INGUINAL HERNIA REPAIR;  Surgeon: Luretha Murphy, MD;  Location: [facility];  Service: General;  Laterality: Right;  . NO PAST SURGERIES      There were no vitals filed for this visit.  Subjective Assessment - 11/29/19 1521    Subjective  Pt reports he has tried to be careful with movement of Lt shoulder.  He states he is mimicing Rt arm with lifting motions, without weights when at gym.    Diagnostic tests  1. Tear of the anterior inferior labrum with a fracture of theanterior inferior glenoid with mild anterior inferior displacementof the fracture fragment. Focal concavity with adjacent subcorticalmarrow edema involving the superior posterolateral humeral headconsistent with a Hill-Sachs lesion.2. Mild tendinosis of the supraspinatus tendon.3. Moderate tendinosis of the subscapularis tendon with apartial-thickness tear.4. Muscle edema in the superior aspect of the subscapularis musclemost consistent with a small partial tear at the musculotendinousjunction.    Patient Stated Goals  Return to full functional use of  the left shoulder    Currently in Pain?  No/denies         [facility] PT Assessment - 11/29/19 0001      Assessment   Medical Diagnosis  left shoulder dislocation    Referring Provider (PT)  Ramond Marrow, MD    Onset Date/Surgical Date  11/07/18    Hand Dominance  Right    Next MD Visit  6 weeks    Prior Therapy  none      AROM   Left Shoulder Extension  30 Degrees   standing   Left Shoulder Flexion  125 Degrees   supine, AAROM    Left Shoulder Internal Rotation  --   thumb to L5   Left Shoulder External Rotation  32 Degrees   seated, arm in neutral       [facility] Adult PT Treatment/Exercise - 11/29/19 0001      Shoulder Exercises: Supine   Horizontal ABduction  AAROM;10 reps   range to tolerance, dowel   Flexion  AAROM;Both;10 reps   press up x 5, overhead flexion x 5, dowel   Other Supine Exercises  supine, Lt arm abdct ~80 deg with forearm off table to gentle chest/ bicep stretch x 15-20 sec x 3 reps       Shoulder Exercises: Standing   Extension  Both;AAROM;10 reps   dowel    Row Limitations  reverse wall push up (pushing elbows into wall) x 10     Other Standing Exercises  scap retraction  x 5 sec hold x 10 (back against pool noodle)      Other Standing Exercises  reviewed exercises to avoid that involve abdct with ER.       Shoulder Exercises: Isometric Strengthening   Flexion  5X5"    Flexion Limitations  red band, reactive isometric x 5 reps     ABduction  5X5"      Shoulder Exercises: Stretch   Internal Rotation Stretch Limitations  IR, hand behind back - to tolerance x 15 sec x 1 rep    Table Stretch - Flexion  3 reps;10 seconds    Table Stretch - Abduction  3 reps;10 seconds      Manual Therapy   Manual Therapy  Soft tissue mobilization    Soft tissue mobilization  IASTM to Lt bicep and wrist flexors to decrease fascial restrictions and improve elbow/shoulder ROM               PT Short Term Goals - 11/15/19 1952      PT SHORT TERM GOAL #1   Title   The patient will be indep with HEP for L shoulder isometrics, gentle ROM, and scapular stabilization.    Time  6    Period  Weeks    Target Date  12/30/19      PT SHORT TERM GOAL #2   Title  The patient will have full PROM of the L shoulder *ER to only be performed in plane of scapula avoiding abd/ER*    Baseline  PROM not fully assessed as PT emphasized stabilization at neutral.    Time  6    Period  Weeks    Target Date  12/30/19      PT SHORT TERM GOAL #3   Title  The patient will have AROM in standing to 120 degrees flexion and scaption.    Time  6    Period  Weeks    Target Date  12/30/19      PT SHORT TERM GOAL #4   Title  --        PT Long Term Goals - 11/15/19 1959      PT LONG TERM GOAL #1   Title  The patient will be indep with HEP progression.    Time  12    Period  Weeks    Target Date  02/13/20      PT LONG TERM GOAL #2   Title  The patient will tolerate functional strengthening program tolerating push up x 10 reps without pain.    Time  12    Period  Weeks    Target Date  02/13/20      PT LONG TERM GOAL #3   Title  The patient will have full AROM L shoulder for flexion, abduction, ER, IR.    Time  12    Period  Weeks    Target Date  02/13/20      PT LONG TERM GOAL #4   Title  The patient will verbalize understanding of appropriate gym routine (avoid overhead press and wide arm bench press until cleared by MD) for return to community exercise.    Time  12    Period  Weeks    Target Date  02/13/20            Plan - 11/29/19 1517    Clinical Impression Statement  Pt presents with tight Lt bicep; when in supine elbow ext not quite to 0 deg.  After IASTM  to bicep, elbow ext to 0.  Pt tolerated all exercises well, except flexion isometric as he was likely leaning into wall; switched to reactive isometric without pain.  Pt making gains towards STG/LTG.    Examination-Activity Limitations  Lift;Reach Overhead    Examination-Participation Restrictions   Cleaning    Stability/Clinical Decision Making  Stable/Uncomplicated    Rehab Potential  Good    PT Frequency  1x / week    PT Duration  12 weeks    PT Treatment/Interventions  ADLs/Self Care Home Management;Electrical Stimulation;Iontophoresis 4mg /ml Dexamethasone;Moist Heat;Ultrasound;Neuromuscular re-education;Therapeutic exercise;Therapeutic activities;Patient/family education;Taping;Passive range of motion;Dry needling;Manual techniques    PT Next Visit Plan  continue progressive ROM for Lt shoulder/elbow  *AVOID ABD/ER; progress isotonic strengthening in plane of scapula as tolerated.    PT Home Exercise Plan  Access Code:    Consulted and Agree with Plan of Care  Patient       Patient will benefit from skilled therapeutic intervention in order to improve the following deficits and impairments:  Pain, Hypermobility, Decreased strength, Decreased range of motion, Impaired flexibility  Visit Diagnosis: Acute pain of left shoulder  Muscle weakness (generalized)  Other symptoms and signs involving the musculoskeletal system     Problem List Patient Active Problem List   Diagnosis Date Noted  . Shoulder dislocation, left, initial encounter 11/08/2019  . Inguinal hernia 07/19/2019  . Rash and nonspecific skin eruption 10/05/2016  . History of shingles 04/22/2016   06/23/2016, PTA 11/29/19 3:22 PM  [facility] [place] [address] [place], [place], [place] Phone: [phone]   Fax:  [phone]  Name: Randy Hess MRN: [id number] Date of Birth: Oct 11, 1971

## 2019-12-06 ENCOUNTER — Encounter: Payer: BLUE CROSS/BLUE SHIELD | Admitting: Rehabilitative and Restorative Service Providers"

## 2019-12-20 ENCOUNTER — Other Ambulatory Visit: Payer: Self-pay

## 2019-12-20 ENCOUNTER — Encounter: Payer: Self-pay | Admitting: Rehabilitative and Restorative Service Providers"

## 2019-12-20 ENCOUNTER — Ambulatory Visit (INDEPENDENT_AMBULATORY_CARE_PROVIDER_SITE_OTHER): Payer: BLUE CROSS/BLUE SHIELD | Admitting: Rehabilitative and Restorative Service Providers"

## 2019-12-20 DIAGNOSIS — R29898 Other symptoms and signs involving the musculoskeletal system: Secondary | ICD-10-CM

## 2019-12-20 DIAGNOSIS — M6281 Muscle weakness (generalized): Secondary | ICD-10-CM | POA: Diagnosis not present

## 2019-12-20 DIAGNOSIS — M25512 Pain in left shoulder: Secondary | ICD-10-CM

## 2019-12-20 NOTE — Patient Instructions (Signed)
Access Code: [email or web address]  URL: [email or web address]  Date: 12/20/2019  Prepared by: Margretta Ditty   Exercises Supine Shoulder Flexion Extension AAROM with Dowel - 10 reps - 1 sets - 5 hold - 1x daily - 7x weekly Supine Shoulder Horizontal Abduction Adduction AAROM with Dowel - 10 reps - 1 sets - 5 hold - 1x daily - 7x weekly Standing Shoulder Scaption - 10 reps - 1 sets - 2x daily - 7x weekly Standing Bicep Curls Supinated with Dumbbells - 10 reps - 1 sets                            - 2x daily - 7x weekly Isometric Shoulder Abduction at Wall - 10 reps - 1 sets - 5 seconds hold - 2x daily - 7x weekly Full Plank with Scapular Protraction Retraction AROM - 10 reps - 1 sets - 2x daily - 7x weekly Standing Shoulder Extension with Dowel - 10 reps - 1 sets - 5 seconds hold - 1x daily - 7x weekly Shoulder Internal Rotation Reactive Isometrics - 10 reps - 1 sets - 2x daily - 7x weekly Shoulder External Rotation Reactive Isometrics - 10 reps - 1 sets - 2x daily - 7x weekly Standing Shoulder Flexion Reactive Isometric - 10 reps - 2 sets - 1x daily - 7x weekly Single Arm Shoulder Extension with Anchored Resistance - 10 reps - 2 sets - 1x daily - 7x weekly Standing Row with Anchored Resistance - 10 reps - 2 sets - 1x daily - 7x weekly

## 2019-12-20 NOTE — Therapy (Signed)
[facility] [place], [place], [address] Phone: [phone]   Fax:  [phone]  Physical Therapy Treatment  Patient Details  Name: Randy Hess MRN: [id number] Date of Birth: 01/06/1971 Referring Provider (PT): Ophelia Charter, MD   Encounter Date: 12/20/2019  PT End of Session - 12/20/19 1504    Visit Number  3    Number of Visits  12    Date for PT Re-Evaluation  02/13/20    PT Start Time  1400    PT Stop Time  1444    PT Time Calculation (min)  44 min    Activity Tolerance  Patient tolerated treatment well;No increased pain    Behavior During Therapy  WFL for tasks assessed/performed       Past Medical History:  Diagnosis Date  . History of shingles 04/22/2016    Past Surgical History:  Procedure Laterality Date  . INGUINAL HERNIA REPAIR Right 09/27/2019   Procedure: OPEN RIGHT INGUINAL HERNIA REPAIR;  Surgeon: Johnathan Hausen, MD;  Location: [facility];  Service: General;  Laterality: Right;  . NO PAST SURGERIES      There were no vitals filed for this visit.  Subjective Assessment - 12/20/19 1359    Subjective  The patient reports he has started to add weights at the gym (5 lbs for shoulder flexion to 90, 5 lb overhead press, 5 lb row-- patient demonstrates shoulder shrug as he shows this exercise).  There are still movements that tweak the left shoulder (reaching out to the side or behind his head -- PT reviewed these are movements he should continue to avoid).    Diagnostic tests  1. Tear of the anterior inferior labrum with a fracture of theanterior inferior glenoid with mild anterior inferior displacementof the fracture fragment. Focal concavity with adjacent subcorticalmarrow edema involving the superior posterolateral humeral headconsistent with a Hill-Sachs lesion.2. Mild tendinosis of the supraspinatus tendon.3. Moderate tendinosis of the subscapularis tendon with apartial-thickness  tear.4. Muscle edema in the superior aspect of the subscapularis musclemost consistent with a small partial tear at the musculotendinousjunction.    Patient Stated Goals  Return to full functional use of the left shoulder    Currently in Pain?  No/denies         [facility] PT Assessment - 12/20/19 1420      Assessment   Medical Diagnosis  left shoulder dislocation    Referring Provider (PT)  Ophelia Charter, MD    Onset Date/Surgical Date  11/07/18    Hand Dominance  Right      AROM   Overall AROM   Deficits    Left Shoulder Flexion  142 Degrees   supine AROM                  [facility] Adult PT Treatment/Exercise - 12/20/19 1405      Self-Care   Self-Care  Other Self-Care Comments    Other Self-Care Comments   discussed patient's current gym routine and discussed modifications to avoid shoulder shrug or further injury to the shoulder  *recommended holding shoulder press with 5 lbs as patient is not able to complete full AROM without resistance into flexion and gets apprehension near end range; also recommended he avoid shoulder short arm abduction due to shoulder shrug      Exercises   Exercises  Shoulder      Shoulder Exercises: Supine   Horizontal ABduction  AAROM;10 reps    Horizontal ABduction Limitations  range to tolerance    Flexion  AAROM;Both;10 reps    ABduction  AROM    ABduction Limitations  sliding along mat to 85 degrees x 3 reps      Shoulder Exercises: Standing   Protraction  Strengthening;Both;10 reps    Protraction Limitations  serratus press in weight bearing against wall    External Rotation  Strengthening;Left;5 reps    External Rotation Limitations  reactive isometrics stepping away from door with red theraband and return    Internal Rotation  Strengthening;Left;5 reps    Internal Rotation Limitations  reactive isometrics stepping away from door with red theraband and return    ABduction  Strengthening;Left;5 reps;AROM    ABduction Limitations   demonstrated with short arm abduction and then modifed to AROM into scaption without resistance and discussed slowly adding weight (1 lb using can of soup, then 3 lb weights)    Extension  Both;AAROM    Other Standing Exercises  Demonstrated gym routine:  5 lb shoulder flexion 0-90 degrees with good mechanics and no pain, short arm abducted *replaced with scaption to avoid shoulder shrug; overhead press *recommended not doing due to risk of ant/inferior subluxation with weighted overhead movement at this time      Shoulder Exercises: Therapy Ball   Flexion  Left;10 reps    Scaption  Left;10 reps      Shoulder Exercises: ROM/Strengthening   UBE (Upper Arm Bike)  L2:  2 minutes forward/ 2 minutes backwards      Shoulder Exercises: Isometric Strengthening   External Rotation  5X5"    External Rotation Limitations  red band, reactive isometrics    Internal Rotation  5X5"    Internal Rotation Limitations  red band, reactive isometrics    ABduction  5X5"             PT Education - 12/20/19 1503    Education Details  HEP updated    Person(s) Educated  Patient    Methods  Explanation;Demonstration;Handout    Comprehension  Verbalized understanding;Returned demonstration       PT Short Term Goals - 12/20/19 1636      PT SHORT TERM GOAL #1   Title  The patient will be indep with HEP for L shoulder isometrics, gentle ROM, and scapular stabilization.    Time  6    Period  Weeks    Status  Achieved    Target Date  12/30/19      PT SHORT TERM GOAL #2   Title  The patient will have full PROM of the L shoulder *ER to only be performed in plane of scapula avoiding abd/ER*    Baseline  PROM not fully assessed as PT emphasized stabilization at neutral.    Time  6    Period  Weeks    Target Date  12/30/19      PT SHORT TERM GOAL #3   Title  The patient will have AROM in standing to 120 degrees flexion and scaption.    Time  6    Period  Weeks    Target Date  12/30/19        PT  Long Term Goals - 11/15/19 1959      PT LONG TERM GOAL #1   Title  The patient will be indep with HEP progression.    Time  12    Period  Weeks    Target Date  02/13/20      PT LONG TERM GOAL #2  Title  The patient will tolerate functional strengthening program tolerating push up x 10 reps without pain.    Time  12    Period  Weeks    Target Date  02/13/20      PT LONG TERM GOAL #3   Title  The patient will have full AROM L shoulder for flexion, abduction, ER, IR.    Time  12    Period  Weeks    Target Date  02/13/20      PT LONG TERM GOAL #4   Title  The patient will verbalize understanding of appropriate gym routine (avoid overhead press and wide arm bench press until cleared by MD) for return to community exercise.    Time  12    Period  Weeks    Target Date  02/13/20            Plan - 12/20/19 1504    Clinical Impression Statement  Patient improving with ROM and functional use of the L UE.  He is eager to return to gym routine and is modifying some exercises to perform in gym setting.  PT reviewed his HEP, added more isometric strengthening to tolerance and reviewed patient's gym program to modify based on technique with exercise.  Patient to f/u with Dr. Everardo Pacific next week and then return to therapy if indicated in 3 weeks.  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 Patient is very motivated to participate in exercise and PT is providing progression of activities due to length of time in between sessions.    Examination-Activity Limitations  Lift;Reach Overhead    Examination-Participation Restrictions  Cleaning    Stability/Clinical Decision Making  Stable/Uncomplicated    Rehab Potential  Good    PT Frequency  1x / week    PT Duration  12 weeks    PT Treatment/Interventions  ADLs/Self Care Home Management;Electrical Stimulation;Iontophoresis 4mg /ml Dexamethasone;Moist Heat;Ultrasound;Neuromuscular  re-education;Therapeutic exercise;Therapeutic activities;Patient/family education;Taping;Passive range of motion;Dry needling;Manual techniques    PT Next Visit Plan  continue progressive ROM for Lt shoulder/elbow  *AVOID ABD/ER; progress isotonic strengthening in plane of scapula as tolerated, shoulder stabiization.  Continue to review patient's HEP and provide modifications.    PT Home Exercise Plan  Access Code:    Consulted and Agree with Plan of Care  Patient       Patient will benefit from skilled therapeutic intervention in order to improve the following deficits and impairments:  Pain, Hypermobility, Decreased strength, Decreased range of motion, Impaired flexibility  Visit Diagnosis: Acute pain of left shoulder  Muscle weakness (generalized)  Other symptoms and signs involving the musculoskeletal system     Problem List Patient Active Problem List   Diagnosis Date Noted  . Shoulder dislocation, left, initial encounter 11/08/2019  . Inguinal hernia 07/19/2019  . Rash and nonspecific skin eruption 10/05/2016  . History of shingles 04/22/2016    Delynda Sepulveda, PT 12/20/2019, 4:47 PM  [facility] [address] [place], [place], [place] Phone: [phone]   Fax:  [phone]  Name: Randy Hess MRN: Felipe Drone Date of Birth: September 07, 1971

## 2020-01-10 ENCOUNTER — Encounter: Payer: Self-pay | Admitting: Rehabilitative and Restorative Service Providers"

## 2020-01-10 ENCOUNTER — Ambulatory Visit (INDEPENDENT_AMBULATORY_CARE_PROVIDER_SITE_OTHER): Payer: BLUE CROSS/BLUE SHIELD | Admitting: Rehabilitative and Restorative Service Providers"

## 2020-01-10 ENCOUNTER — Other Ambulatory Visit: Payer: Self-pay

## 2020-01-10 DIAGNOSIS — M25512 Pain in left shoulder: Secondary | ICD-10-CM

## 2020-01-10 DIAGNOSIS — M6281 Muscle weakness (generalized): Secondary | ICD-10-CM | POA: Diagnosis not present

## 2020-01-10 NOTE — Patient Instructions (Signed)
Access Code: [email or web address] URL: [email or web address] Date: 01/10/2020 Prepared by: Margretta Ditty  Exercises Full Plank with Scapular Protraction Retraction AROM - 2 x daily - 7 x weekly - 10 reps - 1 sets Full Plank with Shoulder Taps - 2 x daily - 7 x weekly - 1 sets - 10 reps Standing Shoulder Internal Rotation Stretch with Towel - 2 x daily - 7 x weekly - 1 sets - 2-3 reps - 20 seconds hold

## 2020-01-10 NOTE — Therapy (Signed)
[facility] [place], [place], [address] Phone: [phone]   Fax:  [phone]  Physical Therapy Treatment and Discharge Summary  Patient Details  Name: Randy Hess MRN: [id number] Date of Birth: 10-Apr-1971 Referring Provider (PT): Ophelia Charter, MD   Encounter Date: 01/10/2020  PT End of Session - 01/10/20 1605    Visit Number  4    Number of Visits  12    Date for PT Re-Evaluation  02/13/20    PT Start Time  1435    PT Stop Time  1414    PT Time Calculation (min)  1419 min    Activity Tolerance  Patient tolerated treatment well;No increased pain    Behavior During Therapy  WFL for tasks assessed/performed       Past Medical History:  Diagnosis Date  . History of shingles 04/22/2016    Past Surgical History:  Procedure Laterality Date  . INGUINAL HERNIA REPAIR Right 09/27/2019   Procedure: OPEN RIGHT INGUINAL HERNIA REPAIR;  Surgeon: Johnathan Hausen, MD;  Location: [facility];  Service: General;  Laterality: Right;  . NO PAST SURGERIES      There were no vitals filed for this visit.  Subjective Assessment - 01/10/20 1536    Subjective  The patient saw Dr. Griffin Basil after last session with good motion and feels we can end therapy today with HEP.    Diagnostic tests  1. Tear of the anterior inferior labrum with a fracture of theanterior inferior glenoid with mild anterior inferior displacementof the fracture fragment. Focal concavity with adjacent subcorticalmarrow edema involving the superior posterolateral humeral headconsistent with a Hill-Sachs lesion.2. Mild tendinosis of the supraspinatus tendon.3. Moderate tendinosis of the subscapularis tendon with apartial-thickness tear.4. Muscle edema in the superior aspect of the subscapularis musclemost consistent with a small partial tear at the musculotendinousjunction.    Patient Stated Goals  Return to full functional use of the left shoulder     Currently in Pain?  No/denies   happened 2-3 times this week describing a bone ache after movement        [facility] PT Assessment - 01/10/20 1558      ROM / Strength   AROM / PROM / Strength  AROM;Strength      AROM   Left Shoulder Flexion  150 Degrees    Left Shoulder ABduction  150 Degrees   mild scapular elevation   Left Shoulder Internal Rotation  --   to T12, R to T5   Left Shoulder External Rotation  60 Degrees   with arm abducted to 70 deg                  [facility] Adult PT Treatment/Exercise - 01/10/20 1542      Self-Care   Self-Care  Other Self-Care Comments    Other Self-Care Comments   Discussed gym routine:  1)  overhead press with 5 lb (does not extend L UE fully), 2) kick backs bilat with 5 lbs, 3) abduction 4) flexion 5)push ups 6) rows (corrected technique for scapula.    Discussed avoiding wide bench press, overhead weighted lifts.  Discussed isometric holds for dips at this time.      Exercises   Exercises  Shoulder      Shoulder Exercises: Prone   Retraction  Strengthening;Left;5 reps    Retraction Weight (lbs)  5    Retraction Limitations  emphasizing scapular retraction    Other Prone Exercises  Pus ups  x 10 reps, push up to plank on inclined surface, *demonstrated extended plank to shoulder touch for HEP challenge      Shoulder Exercises: Standing   Flexion  Strengthening;Left    Shoulder Flexion Weight (lbs)  5    Flexion Limitations  demonstrated 2-3 reps lifting to 90 degrees    ABduction  Strengthening;Left;5 reps    Shoulder ABduction Weight (lbs)  5    ABduction Limitations  patient has mild L shoulder elevation    Other Standing Exercises  Overhead press with 5 lbs through partial ROM    Other Standing Exercises  Demonstrated lat pull down with L UE -- recommended single pulley versus bar.      Shoulder Exercises: Stretch   Internal Rotation Stretch  2 reps    Internal Rotation Stretch Limitations  IR with towel stretch to tolerance              PT Education - 01/10/20 1604    Education Details  HEP updated    Person(s) Educated  Patient    Methods  Explanation;Demonstration;Handout    Comprehension  Verbalized understanding;Returned demonstration       PT Short Term Goals - 01/10/20 1602      PT SHORT TERM GOAL #1   Title  The patient will be indep with HEP for L shoulder isometrics, gentle ROM, and scapular stabilization.    Time  6    Period  Weeks    Status  Achieved    Target Date  12/30/19      PT SHORT TERM GOAL #2   Title  The patient will have full PROM of the L shoulder *ER to only be performed in plane of scapula avoiding abd/ER*    Baseline  PROM not fully assessed as PT emphasized stabilization at neutral.    Time  6    Period  Weeks    Status  Deferred    Target Date  12/30/19      PT SHORT TERM GOAL #3   Title  The patient will have AROM in standing to 120 degrees flexion and scaption.    Time  6    Period  Weeks    Status  Achieved    Target Date  12/30/19        PT Long Term Goals - 01/10/20 1602      PT LONG TERM GOAL #1   Title  The patient will be indep with HEP progression.    Time  12    Period  Weeks    Status  Achieved      PT LONG TERM GOAL #2   Title  The patient will tolerate functional strengthening program tolerating push up x 10 reps without pain.    Time  12    Period  Weeks    Status  Achieved      PT LONG TERM GOAL #3   Title  The patient will have full AROM L shoulder for flexion, abduction, ER, IR.    Time  12    Period  Weeks    Status  Partially Met      PT LONG TERM GOAL #4   Title  The patient will verbalize understanding of appropriate gym routine (avoid overhead press and wide arm bench press until cleared by MD) for return to community exercise.    Time  12    Period  Weeks    Status  Achieved  Plan - 01/10/20 1715    Clinical Impression Statement  The patient partially met LTGs.  He has made exceptional progress  through self mgmt of home exercises and return to a modified gym routine.  The patient is aware of motions to avoid in current routine and is able to continue strengthening post d/c with gym routine.    Stability/Clinical Decision Making  Stable/Uncomplicated    Rehab Potential  Good    PT Frequency  1x / week    PT Duration  12 weeks    PT Treatment/Interventions  ADLs/Self Care Home Management;Electrical Stimulation;Iontophoresis '4mg'$ /ml Dexamethasone;Moist Heat;Ultrasound;Neuromuscular re-education;Therapeutic exercise;Therapeutic activities;Patient/family education;Taping;Passive range of motion;Dry needling;Manual techniques    PT Next Visit Plan  discharge today    PT Home Exercise Plan  Access Code: [email or web address]    Consulted and Agree with Plan of Care  Patient       Patient will benefit from skilled therapeutic intervention in order to improve the following deficits and impairments:  Pain, Hypermobility, Decreased strength, Decreased range of motion, Impaired flexibility  Visit Diagnosis: Acute pain of left shoulder  Muscle weakness (generalized)     PHYSICAL THERAPY DISCHARGE SUMMARY  Visits from Start of Care: 4  Current functional level related to goals / functional outcomes: See above   Remaining deficits: Limited ER *will continue to limit to allow healing   Education / Equipment: Home program, gym routine modifications.  Plan: Patient agrees to discharge.  Patient goals were partially met. Patient is being discharged due to meeting the stated rehab goals.  ?????         Problem List Patient Active Problem List   Diagnosis Date Noted  . Shoulder dislocation, left, initial encounter 11/08/2019  . Inguinal hernia 07/19/2019  . Rash and nonspecific skin eruption 10/05/2016  . History of shingles 04/22/2016    Tehillah Cipriani , PT 01/10/2020, 5:16 PM  [facility] [place], [place],  [address] Phone: [phone]   Fax:  [phone]  Name: Randy Hess MRN: [id number] Date of Birth: 1970/12/02

## 2020-01-31 ENCOUNTER — Ambulatory Visit: Payer: BLUE CROSS/BLUE SHIELD

## 2020-03-02 LAB — HM COLONOSCOPY

## 2020-07-10 ENCOUNTER — Ambulatory Visit: Payer: BLUE CROSS/BLUE SHIELD | Admitting: Physician Assistant

## 2020-07-17 ENCOUNTER — Ambulatory Visit: Payer: BLUE CROSS/BLUE SHIELD | Admitting: Physician Assistant

## 2020-09-07 ENCOUNTER — Ambulatory Visit: Payer: BLUE CROSS/BLUE SHIELD | Admitting: Dermatology

## 2021-05-03 IMAGING — DX DG SHOULDER 2+V*L*
2 series · 2 of 2 positions shown · non-contrast
Comparison: Earlier study 11/08/2019

CLINICAL DATA: Post reduction of shoulder dislocation

EXAM:
LEFT SHOULDER - 2+ VIEW

[shoulder grashey]
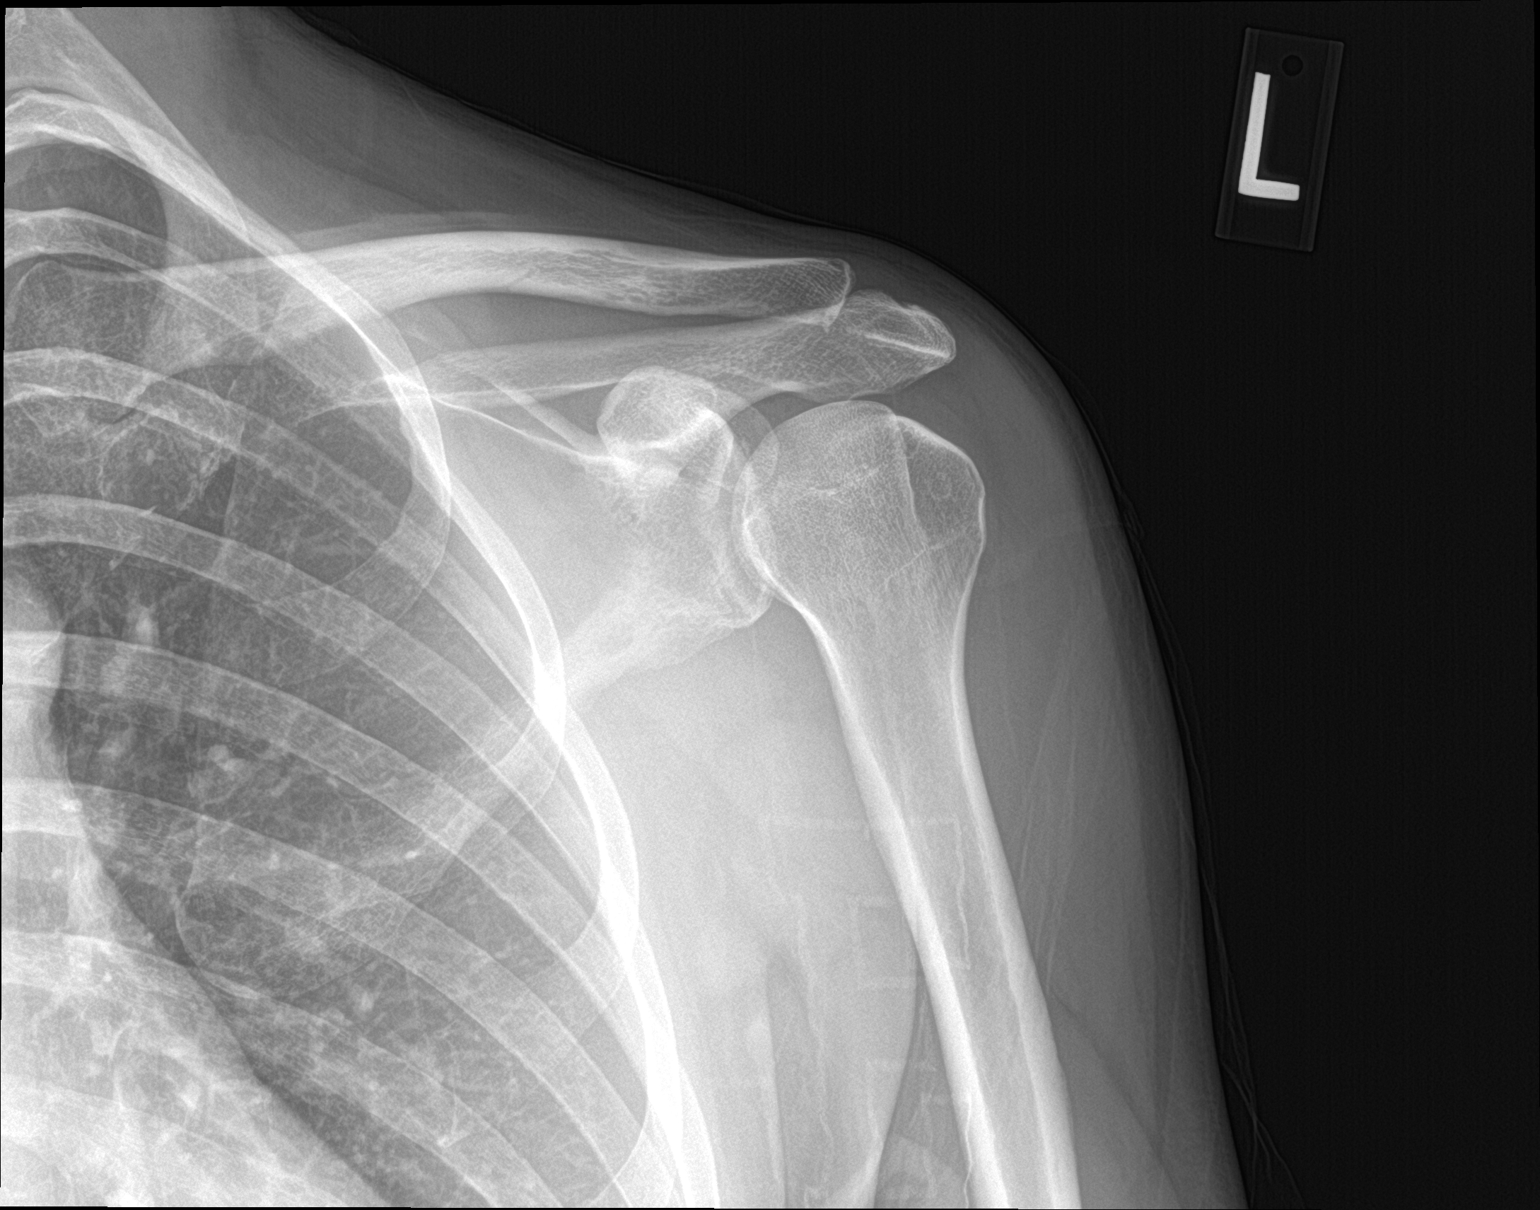

[shoulder y view]
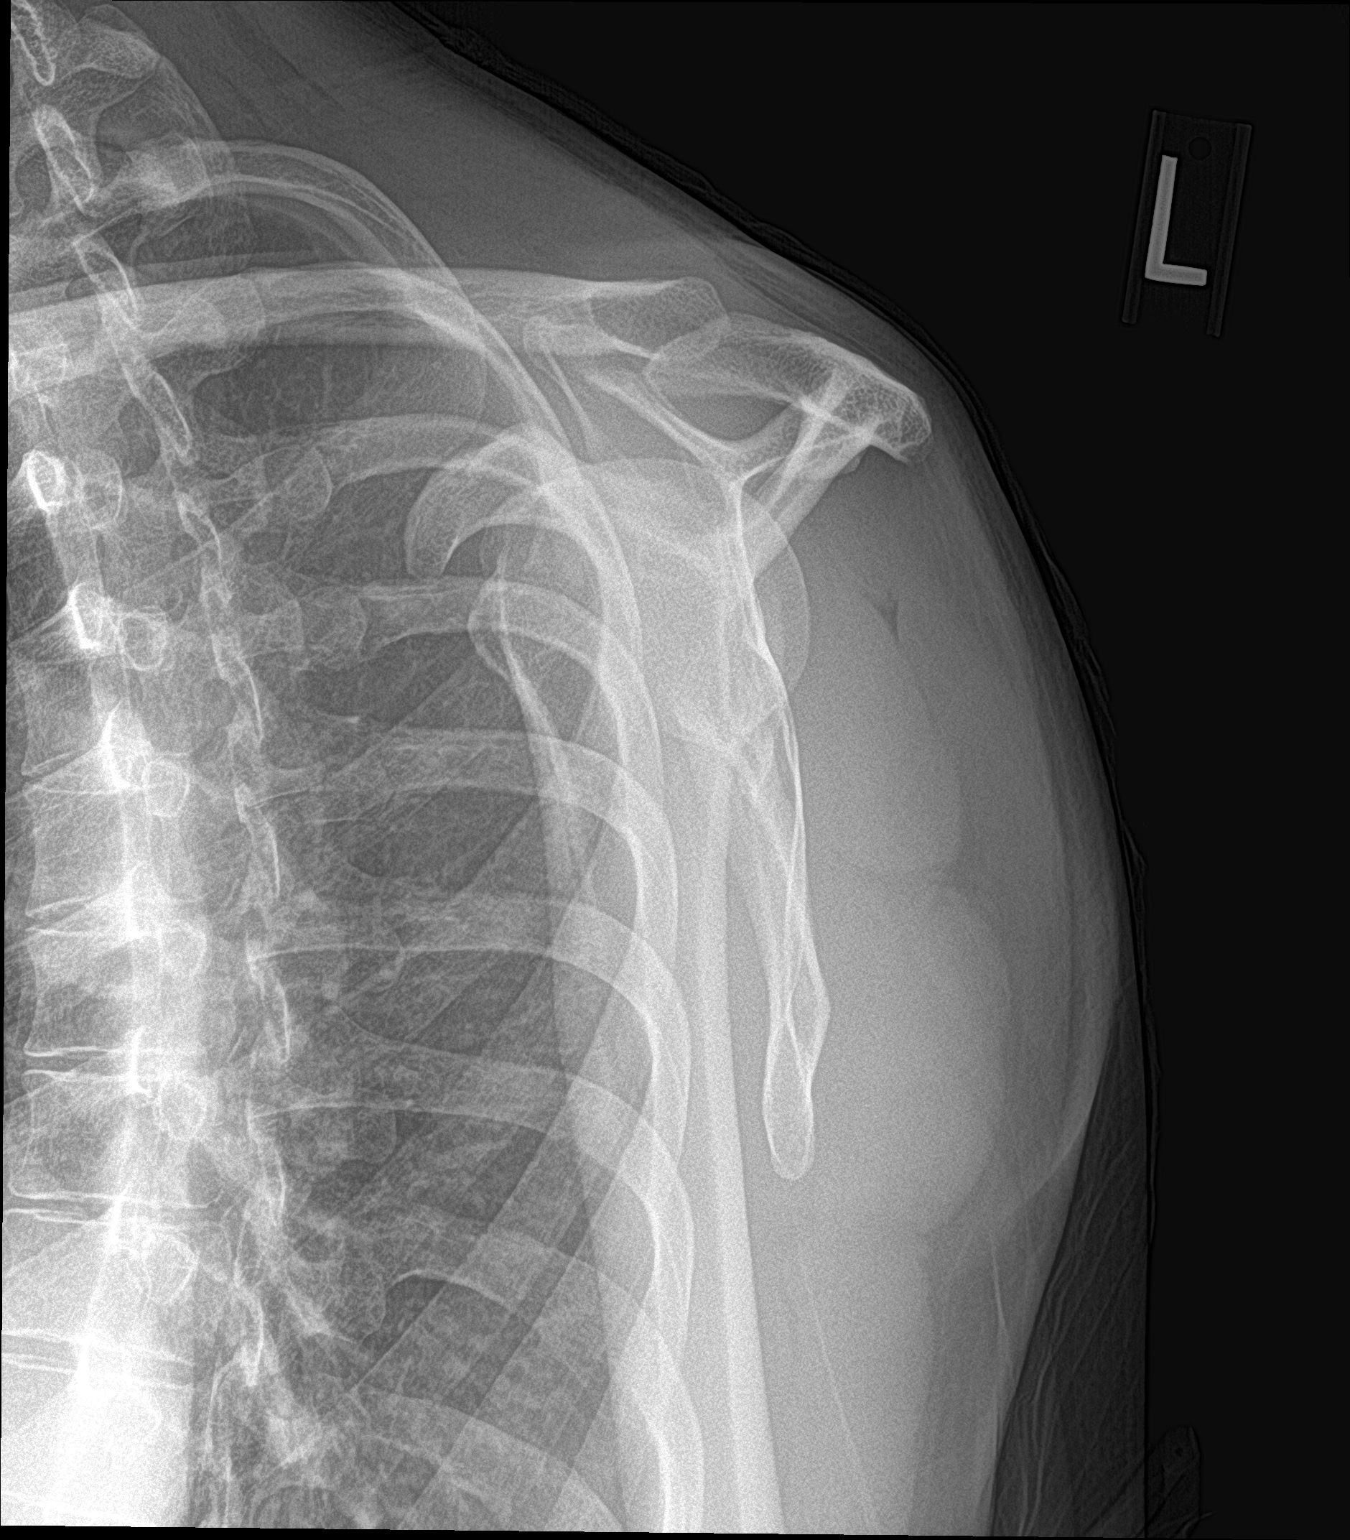

[2 of 2 positions shown; findings below may reference images not displayed]

FINDINGS: Previously identified anterior glenohumeral dislocation appears
reduced.

Hill-Sachs deformity of the LEFT humeral head.

AC joint alignment normal.

No additional fracture seen.
IMPRESSION: Reduction of previously identified LEFT glenohumeral dislocation.

Hill-Sachs impaction deformity of the LEFT humeral head.
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
## 2024-07-31 ENCOUNTER — Encounter: Payer: Self-pay | Admitting: Urgent Care

## 2024-07-31 NOTE — Telephone Encounter (Signed)
 Copied from CRM [id number]. Topic: Clinical - Request for Lab/Test Order >> Jul 31, 2024  8:55 AM Avram MATSU wrote: Reason for CRM: Total lab work order

## 2024-10-03 ENCOUNTER — Ambulatory Visit: Admitting: Urgent Care

## 2024-10-03 ENCOUNTER — Encounter: Payer: Self-pay | Admitting: Urgent Care

## 2024-10-03 VITALS — BP 127/83 | HR 71 | Ht 68.0 in | Wt 156.0 lb

## 2024-10-03 DIAGNOSIS — Z1159 Encounter for screening for other viral diseases: Secondary | ICD-10-CM

## 2024-10-03 DIAGNOSIS — Z0000 Encounter for general adult medical examination without abnormal findings: Secondary | ICD-10-CM | POA: Diagnosis not present

## 2024-10-03 DIAGNOSIS — Z114 Encounter for screening for human immunodeficiency virus [HIV]: Secondary | ICD-10-CM | POA: Diagnosis not present

## 2024-10-03 NOTE — Progress Notes (Signed)
 Annual Wellness Visit     Patient: Randy Hess, Male    DOB: 07-05-1971, 53 y.o.   MRN: [id number]  Subjective  Chief Complaint  Patient presents with   Annual Exam    Establish care; fasting    Randy Hess is a 53 y.o. male who presents today for his Annual Wellness Visit. He reports consuming a general diet. Gym/ health club routine includes cardio, mod to heavy weightlifting, stationary bike, and 5 days a week. He generally feels well. He reports sleeping well. He does not have additional problems to discuss today.   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 Comes home on weekends. 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   Patient Active Problem List   Diagnosis Date Noted   Shoulder dislocation, left, initial encounter 11/08/2019   Inguinal hernia 07/19/2019   Rash and nonspecific skin eruption 10/05/2016   History of shingles 04/22/2016   Past Medical History:  Diagnosis Date   History of shingles 04/22/2016   Past Surgical History:  Procedure Laterality Date   HERNIA REPAIR     INGUINAL HERNIA REPAIR Right 09/27/2019   Procedure: OPEN RIGHT INGUINAL HERNIA REPAIR;  Surgeon: Gladis Cough, MD;  Location: [facility];  Service: General;  Laterality: Right;   NO PAST SURGERIES     Social History[1]    Medications: Show/hide medication list[2]  Allergies[3]  Patient Care Team: Randy Hess, [place] as PCP - General (Physician Assistant)  ROS Complete 12 point ROS performed with all pertinent positives listed in HPI     Objective  BP 127/83   Pulse 71   Ht 5' 8 (1.727 m)   Wt 156 lb (70.8 kg)   SpO2 100%   BMI 23.72 kg/m  BP Readings from Last 3 Encounters:  10/03/24 127/83  09/27/19 119/73  08/09/19 116/78   Wt Readings from Last 3 Encounters:  10/03/24 156 lb (70.8 kg)   09/27/19 149 lb 14.6 oz (68 kg)  08/09/19 148 lb (67.1 kg)      Physical Exam Vitals and nursing note reviewed.  Constitutional:      General: He is not in acute distress.    Appearance: Normal appearance. He is not ill-appearing, toxic-appearing or diaphoretic.  HENT:     Head: Normocephalic and atraumatic.     Right Ear: Tympanic membrane, ear canal and external ear normal. There is no impacted cerumen.     Left Ear: Tympanic membrane, ear canal and external ear normal. There is no impacted cerumen.     Nose: Nose normal.     Mouth/Throat:     Mouth: Mucous membranes are moist.     Pharynx: Oropharynx is clear. No oropharyngeal exudate or posterior oropharyngeal erythema.  Eyes:     General: No scleral icterus.       Right eye: No discharge.        Left eye: No discharge.     Extraocular Movements: Extraocular movements intact.     Pupils: Pupils are equal, round, and reactive to light.  Neck:     Thyroid: No thyroid mass, thyromegaly or thyroid tenderness.  Cardiovascular:     Rate and Rhythm: Normal rate and regular rhythm.     Pulses: Normal pulses.     Heart sounds: No murmur heard. Pulmonary:  Effort: Pulmonary effort is normal. No respiratory distress.     Breath sounds: Normal breath sounds. No stridor. No wheezing or rhonchi.  Abdominal:     General: Abdomen is flat. Bowel sounds are normal. There is no distension.     Palpations: Abdomen is soft. There is no mass.     Tenderness: There is no abdominal tenderness. There is no guarding.  Musculoskeletal:     Cervical back: Normal range of motion and neck supple. No rigidity or tenderness.     Right lower leg: No edema.     Left lower leg: No edema.  Lymphadenopathy:     Cervical: No cervical adenopathy.  Skin:    General: Skin is warm and dry.     Coloration: Skin is not jaundiced.     Findings: No bruising, erythema or rash.  Neurological:     General: No focal deficit present.     Mental Status: He is  alert and oriented to person, place, and time.     Sensory: No sensory deficit.     Motor: No weakness.  Psychiatric:        Mood and Affect: Mood normal.        Behavior: Behavior normal.     Most recent depression screenings:    10/03/2024    8:36 AM 08/09/2019    9:31 AM  PHQ 2/9 Scores  PHQ - 2 Score 0 0  PHQ- 9 Score 0 0      Data saved with a previous flowsheet row definition      10/03/2024    8:37 AM 08/09/2019    9:31 AM  GAD 7 : Generalized Anxiety Score  Nervous, Anxious, on Edge 0 0  Control/stop worrying 0 0  Worry too much - different things 0 0  Trouble relaxing 0 0  Restless 0 0  Easily annoyed or irritable 0 0  Afraid - awful might happen 0 0  Total GAD 7 Score 0 0  Anxiety Difficulty Not difficult at all        10/03/2024    8:36 AM 08/09/2019    9:31 AM  Depression screen PHQ 2/9  Decreased Interest 0 0  Down, Depressed, Hopeless 0 0  PHQ - 2 Score 0 0  Altered sleeping 0 0  Tired, decreased energy 0 0  Change in appetite 0 0  Feeling bad or failure about yourself  0 0  Trouble concentrating 0 0  Moving slowly or fidgety/restless 0 0  Suicidal thoughts 0 0  PHQ-9 Score 0 0   Difficult doing work/chores Not difficult at all      Data saved with a previous flowsheet row definition     Fall Screening Falls in the past year?: 0 Number of falls in past year: 0 Was there an injury with Fall?: 0 Fall Risk Category Calculator: 0 Patient Fall Risk Level: Low Fall Risk  Fall Risk Patient at Risk for Falls Due to: No Fall Risks Fall risk Follow up: Falls evaluation completed    Vision/Hearing Screen: No results found.  Last CBC Lab Results  Component Value Date   WBC 5.1 07/19/2019   HGB 15.6 07/19/2019   HCT 44.8 07/19/2019   MCV 90.9 07/19/2019   MCH 31.6 07/19/2019   RDW 11.9 07/19/2019   PLT 262 07/19/2019   Last metabolic panel Lab Results  Component Value Date   GLUCOSE 101 (H) 07/19/2019   NA 140 07/19/2019   K  4.2 07/19/2019  CL 103 07/19/2019   CO2 27 07/19/2019   BUN 20 07/19/2019   CREATININE 1.00 07/19/2019   GFRNONAA 88 07/19/2019   CALCIUM 9.7 07/19/2019   PROT 7.2 07/19/2019   ALBUMIN 4.6 04/22/2016   BILITOT 0.7 07/19/2019   ALKPHOS 48 04/22/2016   AST 17 07/19/2019   ALT 15 07/19/2019   Last lipids Lab Results  Component Value Date   CHOL 171 07/19/2019   HDL 52 07/19/2019   LDLCALC 104 (H) 07/19/2019   TRIG 67 07/19/2019   CHOLHDL 3.3 07/19/2019   Last hemoglobin A1c Lab Results  Component Value Date   HGBA1C 5.0 07/19/2019      No results found for any visits on 10/03/24.    Assessment & Plan   Annual wellness visit done today including the all of the following: Reviewed patient's Family and Medical History Reviewed and updated list of patient's medical providers Assessment of cognitive impairment was done Assessed patient's functional ability Established a written schedule for health screening services Health Risk Assessent Completed and Reviewed  Exercise Activities and Dietary recommendations  Goals   None     Immunization History  Administered Date(s) Administered   Influenza,inj,Quad PF,6+ Mos 07/19/2019   Tdap 07/19/2019   Zoster Recombinant(Shingrix) 11/15/2019    Health Maintenance  Topic Date Due   HIV Screening  Never done   Hepatitis C Screening  Never done   COVID-19 Vaccine (1 - 2025-26 season) 10/19/2024 (Originally 06/17/2024)   Zoster Vaccines- Shingrix (2 of 2) 01/01/2025 (Originally 01/10/2020)   Influenza Vaccine  01/14/2025 (Originally 05/17/2024)   Pneumococcal Vaccine: 50+ Years (1 of 1 - PCV) 10/03/2025 (Originally 10/19/2019)   Hepatitis B Vaccines 19-59 Average Risk (1 of 3 - 19+ 3-dose series) 10/03/2025 (Originally 10/18/1988)   DTaP/Tdap/Td (2 - Td or Tdap) 07/18/2029   Colonoscopy  03/02/2030   HPV VACCINES  Aged Out   Meningococcal B Vaccine  Aged Out     Discussed health benefits of physical activity, and encouraged  him to engage in regular exercise appropriate for his age and condition.    Problem List Items Addressed This Visit   None Visit Diagnoses       Routine health maintenance    -  Primary   Relevant Orders   CBC with Differential/Platelet   Hemoglobin A1c   TSH   Lipid panel   Comprehensive metabolic panel with GFR   PSA     Encounter for screening for HIV       Relevant Orders   HIV antibody (with reflex)     Need for hepatitis C screening test       Relevant Orders   Hepatitis C Antibody      Wellness visit completed with fasting labs. No health complaints today. Continue healthy lifestyle choices. Negative mental health screening Vaccinations declined today.   Return in about 1 year (around 10/03/2025) for Annual Physical.     Randy LITTIE Gave, PA       [1]  Social History Tobacco Use   Smoking status: Never   Smokeless tobacco: Never  Vaping Use   Vaping status: Never Used  Substance Use Topics   Alcohol use: Not Currently    Comment: occas   Drug use: Never  [2]  Outpatient Medications Prior to Visit  Medication Sig   Multiple Vitamin (MULTIVITAMIN) tablet Take 1 tablet by mouth daily.   No facility-administered medications prior to visit.  [3] No Known Allergies

## 2024-10-03 NOTE — Patient Instructions (Signed)
 Great to meet you! We completed your annual physical and fasting labs today.  Please return annually, sooner if needed.

## 2024-10-04 ENCOUNTER — Ambulatory Visit: Payer: Self-pay | Admitting: Urgent Care

## 2024-10-04 LAB — COMPREHENSIVE METABOLIC PANEL WITH GFR
ALT: 17 IU/L (ref 0–44)
AST: 21 IU/L (ref 0–40)
Albumin: 4.6 g/dL (ref 3.8–4.9)
Alkaline Phosphatase: 72 IU/L (ref 47–123)
BUN/Creatinine Ratio: 17 (ref 9–20)
BUN: 16 mg/dL (ref 6–24)
Bilirubin Total: 0.5 mg/dL (ref 0.0–1.2)
CO2: 22 mmol/L (ref 20–29)
Calcium: 9.4 mg/dL (ref 8.7–10.2)
Chloride: 103 mmol/L (ref 96–106)
Creatinine, Ser: 0.96 mg/dL (ref 0.76–1.27)
Globulin, Total: 2.4 g/dL (ref 1.5–4.5)
Glucose: 89 mg/dL (ref 70–99)
Potassium: 4.3 mmol/L (ref 3.5–5.2)
Sodium: 141 mmol/L (ref 134–144)
Total Protein: 7 g/dL (ref 6.0–8.5)
eGFR: 94 mL/min/1.73

## 2024-10-04 LAB — CBC WITH DIFFERENTIAL/PLATELET
Basophils Absolute: 0.1 x10E3/uL (ref 0.0–0.2)
Basos: 1 %
EOS (ABSOLUTE): 0.2 x10E3/uL (ref 0.0–0.4)
Eos: 3 %
Hematocrit: 47.4 % (ref 37.5–51.0)
Hemoglobin: 16.4 g/dL (ref 13.0–17.7)
Immature Grans (Abs): 0 x10E3/uL (ref 0.0–0.1)
Immature Granulocytes: 0 %
Lymphocytes Absolute: 2 x10E3/uL (ref 0.7–3.1)
Lymphs: 34 %
MCH: 31.8 pg (ref 26.6–33.0)
MCHC: 34.6 g/dL (ref 31.5–35.7)
MCV: 92 fL (ref 79–97)
Monocytes Absolute: 0.6 x10E3/uL (ref 0.1–0.9)
Monocytes: 9 %
Neutrophils Absolute: 3.1 x10E3/uL (ref 1.4–7.0)
Neutrophils: 53 %
Platelets: 291 x10E3/uL (ref 150–450)
RBC: 5.16 x10E6/uL (ref 4.14–5.80)
RDW: 12.2 % (ref 11.6–15.4)
WBC: 6 x10E3/uL (ref 3.4–10.8)

## 2024-10-04 LAB — PSA: Prostate Specific Ag, Serum: 2.2 ng/mL (ref 0.0–4.0)

## 2024-10-04 LAB — HEMOGLOBIN A1C
Est. average glucose Bld gHb Est-mCnc: 105 mg/dL
Hgb A1c MFr Bld: 5.3 % (ref 4.8–5.6)

## 2024-10-04 LAB — LIPID PANEL
Chol/HDL Ratio: 3.9 ratio (ref 0.0–5.0)
Cholesterol, Total: 204 mg/dL — ABNORMAL HIGH (ref 100–199)
HDL: 52 mg/dL
LDL Chol Calc (NIH): 133 mg/dL — ABNORMAL HIGH (ref 0–99)
Triglycerides: 106 mg/dL (ref 0–149)
VLDL Cholesterol Cal: 19 mg/dL (ref 5–40)

## 2024-10-04 LAB — TSH: TSH: 1.59 u[IU]/mL (ref 0.450–4.500)

## 2024-10-04 LAB — HEPATITIS C ANTIBODY: Hep C Virus Ab: NONREACTIVE

## 2024-10-04 LAB — HIV ANTIBODY (ROUTINE TESTING W REFLEX): HIV Screen 4th Generation wRfx: NONREACTIVE

## 2025-10-06 ENCOUNTER — Encounter: Admitting: Urgent Care
